# Patient Record
Sex: Male | Born: 1960 | Race: White | Hispanic: No | Marital: Married | Smoking: Current every day smoker
Health system: Southern US, Community
[De-identification: ages and names within clinical notes are randomized; demographics above are authoritative.]

## PROBLEM LIST (undated history)

## (undated) DIAGNOSIS — I2510 Atherosclerotic heart disease of native coronary artery without angina pectoris: Secondary | ICD-10-CM

## (undated) DIAGNOSIS — R51 Headache: Secondary | ICD-10-CM

## (undated) DIAGNOSIS — I671 Cerebral aneurysm, nonruptured: Secondary | ICD-10-CM

## (undated) DIAGNOSIS — G4730 Sleep apnea, unspecified: Secondary | ICD-10-CM

## (undated) DIAGNOSIS — G510 Bell's palsy: Secondary | ICD-10-CM

---

## 2005-10-21 ENCOUNTER — Emergency Department (HOSPITAL_COMMUNITY): Admission: EM | Admit: 2005-10-21 | Discharge: 2005-10-21 | Payer: Self-pay | Admitting: Emergency Medicine

## 2011-12-30 ENCOUNTER — Emergency Department (HOSPITAL_BASED_OUTPATIENT_CLINIC_OR_DEPARTMENT_OTHER)
Admission: EM | Admit: 2011-12-30 | Discharge: 2011-12-30 | Disposition: A | Discharge status: Home / Self Care | Payer: BC Managed Care – PPO | Attending: Emergency Medicine | Admitting: Emergency Medicine

## 2011-12-30 ENCOUNTER — Encounter (HOSPITAL_BASED_OUTPATIENT_CLINIC_OR_DEPARTMENT_OTHER): Payer: Self-pay | Admitting: Emergency Medicine

## 2011-12-30 DIAGNOSIS — Z23 Encounter for immunization: Secondary | ICD-10-CM | POA: Insufficient documentation

## 2011-12-30 DIAGNOSIS — F17200 Nicotine dependence, unspecified, uncomplicated: Secondary | ICD-10-CM | POA: Insufficient documentation

## 2011-12-30 DIAGNOSIS — L03211 Cellulitis of face: Secondary | ICD-10-CM | POA: Insufficient documentation

## 2011-12-30 DIAGNOSIS — L0201 Cutaneous abscess of face: Secondary | ICD-10-CM

## 2011-12-30 HISTORY — DX: Sleep apnea, unspecified: G47.30

## 2011-12-30 MED ORDER — LIDOCAINE-EPINEPHRINE 2 %-1:100000 IJ SOLN
INTRAMUSCULAR | Status: AC
  Filled 2011-12-30: qty 1

## 2011-12-30 MED ORDER — TETANUS-DIPHTH-ACELL PERTUSSIS 5-2.5-18.5 LF-MCG/0.5 IM SUSP
0.5000 mL | Freq: Once | INTRAMUSCULAR | Status: AC
  Administered 2011-12-30: 0.5 mL via INTRAMUSCULAR
  Filled 2011-12-30: qty 0.5

## 2011-12-30 MED ORDER — CLINDAMYCIN HCL 150 MG PO CAPS: 150.0000 mg | ORAL_CAPSULE | Freq: Three times a day (TID) | ORAL | Status: AC

## 2011-12-30 NOTE — ED Notes (Addendum)
Pt states he had a bump that started in October, but 3-4 days ago it raised and became red and painful. He tried popping it and got blood and pus. The abscess is the size of a half-dollar to the right cheek. Itchy, painful, and burning. Pain in the right eye is starting due to the pressure.

## 2011-12-30 NOTE — ED Provider Notes (Signed)
History     CSN: [id number]  Arrival date & time 12/30/11  4098   First MD Initiated Contact with Patient 12/30/11 2009      Chief Complaint  Patient presents with  . Abscess    (Consider location/radiation/quality/duration/timing/severity/associated sxs/prior treatment) HPI Comments: Pt states that he has a small area to the right cheek that started several months ago:pt states that in the last couple of days the area has gotten very large and red and has had some yellow drainage when picking at the area  Patient is a 51 y.o. male presenting with abscess. The history is provided by the patient. No language interpreter was used.  Abscess  This is a chronic problem. The onset was gradual. The problem has been rapidly worsening. The abscess is present on the face. The abscess is characterized by redness, draining and swelling. It is unknown what he was exposed to. His past medical history does not include atopy in family. There were no sick contacts. He has received no recent medical care.    Past Medical History  Diagnosis Date  . Sleep apnea 8 years ago    History reviewed. No pertinent past surgical history.  History reviewed. No pertinent family history.  History  Substance Use Topics  . Smoking status: Current Everyday Smoker -- 1.0 packs/day for 30 years    Types: Cigarettes  . Smokeless tobacco: Never Used  . Alcohol Use: Yes     occassionally       Review of Systems  Constitutional: Negative.   Eyes: Negative.   Respiratory: Negative.   Cardiovascular: Negative.   Skin: Positive for wound.  Neurological: Negative for numbness.    Allergies  Review of patient's allergies indicates no known allergies.  Home Medications   Current Outpatient Rx  Name Route Sig Dispense Refill  . CLINDAMYCIN HCL 150 MG PO CAPS Oral Take 1 capsule (150 mg total) by mouth 3 (three) times daily. 21 capsule 0    BP 127/82  Pulse 79  Temp(Src) 98.2 F (36.8 C) (Oral)  Resp  18  Ht 5' 11.5" (1.816 m)  Wt 240 lb (108.863 kg)  BMI 33.01 kg/m2  SpO2 98%  Physical Exam  Nursing note and vitals reviewed. Constitutional: He is oriented to person, place, and time. He appears well-developed and well-nourished.  HENT:  Right Ear: External ear normal.  Left Ear: External ear normal.  Eyes: Conjunctivae and EOM are normal. Pupils are equal, round, and reactive to light.  Cardiovascular: Normal rate and regular rhythm.   Pulmonary/Chest: Effort normal and breath sounds normal.  Musculoskeletal: Normal range of motion.  Neurological: He is alert and oriented to person, place, and time.  Skin:       Pt has large fluctuant red area noted to the right cheek  Psychiatric: He has a normal mood and affect.    ED Course  INCISION AND DRAINAGE Performed by: Teressa Lower Authorized by: Teressa Lower Consent: Verbal consent obtained. Written consent not obtained. Risks and benefits: risks, benefits and alternatives were discussed Consent given by: patient Patient understanding: patient does not state understanding of the procedure being performed Patient identity confirmed: verbally with patient Time out: Immediately prior to procedure a "time out" was called to verify the correct patient, procedure, equipment, support staff and site/side marked as required. Type: abscess Body area: head/neck Location details: face Local anesthetic: lidocaine 2% with epinephrine Scalpel size: 11 Incision type: single straight Complexity: simple Drainage: purulent Drainage amount: copious Patient tolerance:  Patient tolerated the procedure well with no immediate complications.   (including critical care time)  Labs Reviewed - No data to display No results found.   1. Facial abscess       MDM  Area decreased in size:discussed with pt the need to follow up recheck as the area is on his face and unsure of what the initial area was         Teressa Lower,  NP 12/30/11 2117

## 2011-12-30 NOTE — Discharge Instructions (Signed)

## 2011-12-31 NOTE — ED Provider Notes (Signed)
Medical screening examination/treatment/procedure(s) were performed by non-physician practitioner and as supervising physician I was immediately available for consultation/collaboration.  Laiah Pouncey, MD 12/31/11 0016 

## 2012-12-16 ENCOUNTER — Emergency Department (HOSPITAL_BASED_OUTPATIENT_CLINIC_OR_DEPARTMENT_OTHER): Payer: BC Managed Care – PPO

## 2012-12-16 ENCOUNTER — Encounter (HOSPITAL_BASED_OUTPATIENT_CLINIC_OR_DEPARTMENT_OTHER): Payer: Self-pay | Admitting: *Deleted

## 2012-12-16 ENCOUNTER — Observation Stay (HOSPITAL_BASED_OUTPATIENT_CLINIC_OR_DEPARTMENT_OTHER)
Admission: EM | Admit: 2012-12-16 | Discharge: 2012-12-18 | Disposition: A | Discharge status: Home / Self Care | Payer: BC Managed Care – PPO | Attending: Internal Medicine | Admitting: Internal Medicine

## 2012-12-16 ENCOUNTER — Observation Stay (HOSPITAL_COMMUNITY): Payer: BC Managed Care – PPO

## 2012-12-16 DIAGNOSIS — K006 Disturbances in tooth eruption: Secondary | ICD-10-CM | POA: Insufficient documentation

## 2012-12-16 DIAGNOSIS — I639 Cerebral infarction, unspecified: Secondary | ICD-10-CM

## 2012-12-16 DIAGNOSIS — I671 Cerebral aneurysm, nonruptured: Secondary | ICD-10-CM

## 2012-12-16 DIAGNOSIS — G4730 Sleep apnea, unspecified: Secondary | ICD-10-CM | POA: Insufficient documentation

## 2012-12-16 DIAGNOSIS — R29810 Facial weakness: Secondary | ICD-10-CM

## 2012-12-16 DIAGNOSIS — I6350 Cerebral infarction due to unspecified occlusion or stenosis of unspecified cerebral artery: Principal | ICD-10-CM | POA: Insufficient documentation

## 2012-12-16 DIAGNOSIS — E785 Hyperlipidemia, unspecified: Secondary | ICD-10-CM | POA: Insufficient documentation

## 2012-12-16 DIAGNOSIS — F17200 Nicotine dependence, unspecified, uncomplicated: Secondary | ICD-10-CM

## 2012-12-16 DIAGNOSIS — G510 Bell's palsy: Secondary | ICD-10-CM

## 2012-12-16 DIAGNOSIS — Z720 Tobacco use: Secondary | ICD-10-CM | POA: Diagnosis present

## 2012-12-16 DIAGNOSIS — E7800 Pure hypercholesterolemia, unspecified: Secondary | ICD-10-CM

## 2012-12-16 DIAGNOSIS — K011 Impacted teeth: Secondary | ICD-10-CM | POA: Diagnosis present

## 2012-12-16 HISTORY — DX: Cerebral aneurysm, nonruptured: I67.1

## 2012-12-16 HISTORY — DX: Headache: R51

## 2012-12-16 LAB — CBC WITH DIFFERENTIAL/PLATELET
Basophils Absolute: 0 10*3/uL (ref 0.0–0.1)
HCT: 44.3 % (ref 39.0–52.0)
Lymphocytes Relative: 40 % (ref 12–46)
Lymphs Abs: 2.1 10*3/uL (ref 0.7–4.0)
Monocytes Absolute: 0.4 10*3/uL (ref 0.1–1.0)
Neutro Abs: 2.6 10*3/uL (ref 1.7–7.7)
Platelets: 147 10*3/uL — ABNORMAL LOW (ref 150–400)
RBC: 5.12 MIL/uL (ref 4.22–5.81)
RDW: 13.7 % (ref 11.5–15.5)
WBC: 5.1 10*3/uL (ref 4.0–10.5)

## 2012-12-16 LAB — BASIC METABOLIC PANEL
CO2: 26 mEq/L (ref 19–32)
Chloride: 105 mEq/L (ref 96–112)
Glucose, Bld: 108 mg/dL — ABNORMAL HIGH (ref 70–99)
Sodium: 141 mEq/L (ref 135–145)

## 2012-12-16 MED ORDER — ASPIRIN 325 MG PO TABS
325.0000 mg | ORAL_TABLET | Freq: Every day | ORAL | Status: DC
  Administered 2012-12-16 – 2012-12-18 (×3): 325 mg via ORAL
  Filled 2012-12-16 (×5): qty 1

## 2012-12-16 MED ORDER — SENNOSIDES-DOCUSATE SODIUM 8.6-50 MG PO TABS: 1.0000 | ORAL_TABLET | Freq: Every evening | ORAL | Status: DC | PRN

## 2012-12-16 MED ORDER — ENOXAPARIN SODIUM 40 MG/0.4ML ~~LOC~~ SOLN
40.0000 mg | SUBCUTANEOUS | Status: DC
  Administered 2012-12-16 – 2012-12-17 (×2): 40 mg via SUBCUTANEOUS
  Filled 2012-12-16 (×3): qty 0.4

## 2012-12-16 MED ORDER — ASPIRIN 300 MG RE SUPP
300.0000 mg | Freq: Every day | RECTAL | Status: DC
  Filled 2012-12-16 (×3): qty 1

## 2012-12-16 NOTE — H&P (Signed)
[facility] History and Physical  Jason Wells ZOX:[id number] DOB: 06-Jul-1961 DOA: 12/16/2012  Referring physician: ED PCP: Patient does not have a primary care physician   Chief Complaint: Facial palsy  HPI: Jason Wells is a 52 y.o. male with ongoing tobacco abuse presented to the med [facility] with left-sided facial weakness. He also reports left upper extremity numbness. Denies any speech difficulty, denies difficulty ambulating, denies any acute intercurrent illnesses. He is on no medications and smokes one pack of cigarettes a day. Patient also reports an impacted left lower jaw wisdom tooth . He is placed on observation to obtain an MRI   Review of Systems:  Positive for being diagnosed with sleep apnea and being under a lot of stress Patient wears glasses for astigmatism  All other systems reviewed and negative  Past Medical History  Diagnosis Date  . Sleep apnea 8 years ago  . Headache    History reviewed. No pertinent past surgical history. Social History:  reports that he has been smoking Cigarettes.  He has a 30 pack-year smoking history. He has never used smokeless tobacco. He reports that  drinks alcohol. He reports that he does not use illicit drugs. Lives with his wife  No Known Allergies  Family History  Problem Relation Age of Onset  . Cancer Mother      Prior to Admission medications   Medication Sig Start Date End Date Taking? Authorizing Provider  Multiple Vitamins-Minerals (MULTIVITAMIN PO) Take 1 tablet by mouth daily.   Yes Historical Provider, MD  naproxen sodium (ANAPROX) 220 MG tablet Take 440 mg by mouth 2 (two) times daily as needed (for pain).   Yes Historical Provider, MD   Physical Exam: Filed Vitals:   12/16/12 1331 12/16/12 1351 12/16/12 1800 12/16/12 1843  BP: 154/94  134/89   Pulse: 68  69   Temp: 97.7 F (36.5 C) 97.7 F (36.5 C) 98.1 F (36.7 C)   TempSrc: Oral  Oral   Resp: 20  18   Height:    5\' 11"   (1.803 m)  Weight: 108.863 kg (240 lb)   115.395 kg (254 lb 6.4 oz)  SpO2: 100%  100%      General:  Alert and oriented x3, head normocephalic/atraumatic  Eyes: Eyes seem to have nystagmus and gaze coordinating movements, pupil are symmetric and reactive to light  ENT: No significant deformity  Neck: No jugular venous distention  Cardiovascular: Regular rate and rhythm without murmurs rubs or gallops  Respiratory: Clear to auscultation bilaterally  Abdomen: Soft nontender nondistended bowel sounds are present  Skin: Warm dry without rashes  Musculoskeletal: Intact  Psychiatric: Euthymic  Neurologic: NIH stroke scale is 2 from a complete left facial paralysis involving nasolabial fold eyes and forehead.  Strength is 5 out of 5 in all 4 extremities, sensation is intact, deep tendon reflexes are symmetric, finger to nose is intact, heel-to-shin intact, speech intact, reading intact, repetition intact  Labs on Admission:  Basic Metabolic Panel:  Recent Labs Lab 12/16/12 1410  NA 141  K 3.8  CL 105  CO2 26  GLUCOSE 108*  BUN 19  CREATININE 0.90  CALCIUM 9.4   Liver Function Tests: No results found for this basename: AST, ALT, ALKPHOS, BILITOT, PROT, ALBUMIN,  in the last 168 hours No results found for this basename: LIPASE, AMYLASE,  in the last 168 hours No results found for this basename: AMMONIA,  in the last 168 hours CBC:  Recent Labs Lab 12/16/12 1410  WBC 5.1  NEUTROABS 2.6  HGB 15.4  HCT 44.3  MCV 86.5  PLT 147*   Cardiac Enzymes: No results found for this basename: CKTOTAL, CKMB, CKMBINDEX, TROPONINI,  in the last 168 hours  BNP (last 3 results) No results found for this basename: PROBNP,  in the last 8760 hours CBG: No results found for this basename: GLUCAP,  in the last 168 hours  Radiological Exams on Admission: Ct Head Wo Contrast  12/16/2012  *RADIOLOGY REPORT*  Clinical Data: Left-sided facial droop.  Numbness of the face and left arm  for 1 day.  CT HEAD WITHOUT CONTRAST  Technique:  Contiguous axial images were obtained from the base of the skull through the vertex without contrast.  Comparison:  None.  Findings:  There is no evidence for acute infarction, intracranial hemorrhage, mass lesion, hydrocephalus, or extra-axial fluid. There is no atrophy or white matter disease.  Calvarium is intact. No significant vascular calcification.  Negative orbits, sinuses, and mastoids.  IMPRESSION:  Negative exam.   Original Report Authenticated By: Davonna Belling, M.D.       Assessment/Plan Principal Problem:   Facial palsy Active Problems:   Tobacco abuse   Sleep apnea   Tooth impaction   1. Facial palsy-patient has features consistent with central neuron deficits. Plan to obtain an MRI to rule out an intracranial pathology. Start aspirin daily. Obtain frequent neurological checks through the night and keep the patient on telemetry. Check fasting lipid panel and hemoglobin A1c 2. Tobacco abuse-counseled. He refused a nicotine patch for now 3. Impacted wisdom tooth-will obtain orthopantogram 4. Sleep apnea-needs primary care physician   Code Status: Full code (must indicate code status--if unknown or must be presumed, indicate so) Family Communication: Wife in room (indicate person spoken with, if applicable, with phone number if by telephone) Disposition Plan:  home (indicate anticipated LOS)    Prima Rayner [facility] Pager [phone]  If 7PM-7AM, please contact night-coverage [email or web address] Password [facility] 12/16/2012, 7:06 PM

## 2012-12-16 NOTE — Progress Notes (Signed)
Received called from [facility] regarding patient Jason Wells, 52 y/o man with pmh of sleep apnea, tobacco abuse and HTN (not on any medication); went to bed with mild tingling.funny sensation on her face (left side), and woke up with facial droop and numbness. Per history provided most likely Bell's palsy; but there is concerns for stroke and MRI not available at [facility] today. Will admit on observation to r/o CVA. Team 10 as assigned team for transfer, tele bed requested.  Deina Lipsey [phone]

## 2012-12-16 NOTE — ED Provider Notes (Signed)
History     CSN: [id number]  Arrival date & time 12/16/12  1325   First MD Initiated Contact with Patient 12/16/12 1352      Chief Complaint  Patient presents with  . Facial Droop    (Consider location/radiation/quality/duration/timing/severity/associated sxs/prior treatment) HPI Comments: Patient is a 52 year old male with a past medical history of sleep apnea who presents with left facial droop since last night. Patient reports watching TV last night when he noticed a "sensation" in the left side of his face. Patient reports going to work this morning and people were asking if he had a stroke so he came to the hospital. Patient reports associated numbness in his left shoulder. He has no other complaints at this time. The facial droop has remained constant since the onset. No aggravating/alleviating factors.    Past Medical History  Diagnosis Date  . Sleep apnea 8 years ago    History reviewed. No pertinent past surgical history.  No family history on file.  History  Substance Use Topics  . Smoking status: Current Every Day Smoker -- 1.00 packs/day for 30 years    Types: Cigarettes  . Smokeless tobacco: Never Used  . Alcohol Use: Yes     Comment: occassionally       Review of Systems  Neurological: Positive for facial asymmetry.  All other systems reviewed and are negative.    Allergies  Review of patient's allergies indicates no known allergies.  Home Medications  No current outpatient prescriptions on file.  BP 154/94  Pulse 68  Temp(Src) 97.7 F (36.5 C) (Oral)  Resp 20  Wt 240 lb (108.863 kg)  BMI 33.01 kg/m2  SpO2 100%  Physical Exam  Nursing note and vitals reviewed. Constitutional: He is oriented to person, place, and time. He appears well-developed and well-nourished. No distress.  HENT:  Head: Normocephalic and atraumatic.  Mouth/Throat: Oropharynx is clear and moist. No oropharyngeal exudate.  Eyes: Conjunctivae and EOM are normal. Pupils  are equal, round, and reactive to light. No scleral icterus.  Neck: Normal range of motion.  Cardiovascular: Normal rate and regular rhythm.  Exam reveals no gallop and no friction rub.   No murmur heard. Pulmonary/Chest: Effort normal and breath sounds normal. He has no wheezes. He has no rales. He exhibits no tenderness.  Abdominal: Soft. He exhibits no distension. There is no tenderness. There is no rebound and no guarding.  Musculoskeletal: Normal range of motion.  Neurological: He is alert and oriented to person, place, and time. A cranial nerve deficit is present. Coordination normal.  Left side facial droop with forehead involvement noted. no other cranial nerve deficit noted besides facial droop. Sensation equal and intact bilaterally. Speech is goal-oriented. Moves limbs without ataxia.   Skin: Skin is warm and dry. He is not diaphoretic.  Psychiatric: He has a normal mood and affect. His behavior is normal.    ED Course  Procedures (including critical care time)   Date: 12/16/2012  Rate: 66  Rhythm: normal sinus rhythm  QRS Axis: normal  Intervals: normal  ST/T Wave abnormalities: normal  Conduction Disutrbances:none  Narrative Interpretation: NSR with no previous for comparison  Old EKG Reviewed: none available    Labs Reviewed  CBC WITH DIFFERENTIAL - Abnormal; Notable for the following:    Platelets 147 (*)    All other components within normal limits  BASIC METABOLIC PANEL - Abnormal; Notable for the following:    Glucose, Bld 108 (*)    All  other components within normal limits   Dg Orthopantogram  12/16/2012  *RADIOLOGY REPORT*  Clinical Data: Left-sided jaw pain.  ORTHOPANTOGRAM/PANORAMIC  Comparison: None.  Findings: Periapical lucency surrounding the root of the second left mandibular molar (tooth #18).  Tooth bud or residual tooth material in the left maxilla related to the third molar (tooth #16).  Absent central incisors in the mandible and absent lateral  incisors in the maxilla.  IMPRESSION: Periapical lucency surrounding the root of tooth #18 in the left mandible, possibly an abscess.  Residual tooth material in the posterior left maxilla (tooth #16).   Original Report Authenticated By: Hulan Saas, M.D.    Ct Head Wo Contrast  12/16/2012  *RADIOLOGY REPORT*  Clinical Data: Left-sided facial droop.  Numbness of the face and left arm for 1 day.  CT HEAD WITHOUT CONTRAST  Technique:  Contiguous axial images were obtained from the base of the skull through the vertex without contrast.  Comparison:  None.  Findings:  There is no evidence for acute infarction, intracranial hemorrhage, mass lesion, hydrocephalus, or extra-axial fluid. There is no atrophy or white matter disease.  Calvarium is intact. No significant vascular calcification.  Negative orbits, sinuses, and mastoids.  IMPRESSION:  Negative exam.   Original Report Authenticated By: Davonna Belling, M.D.    Mr Brain Wo Contrast  12/17/2012  *RADIOLOGY REPORT*  Clinical Data:  Left-sided facial droop.  Numbness face and left arm. Smoker.  MRI BRAIN WITHOUT CONTRAST MRA HEAD WITHOUT CONTRAST  Technique: Multiplanar, multiecho pulse sequences of the brain and surrounding structures were obtained according to standard protocol without intravenous contrast.  Angiographic images of the head were obtained using MRA technique without contrast.  Comparison: 12/16/2012 CT.  No comparison MR.  MRI HEAD  Findings:  Questionable tiny acute non hemorrhagic infarct posterior aspect of the posterior limb of the right internal capsule adjacent to the right atrium (series 4 image 16).  No intracranial hemorrhage.  No intracranial mass lesion detected on this unenhanced exam.  Very mild nonspecific white matter type changes most notable right frontal lobe may reflect changes of small vessel disease.  Mild soft tissue prominence posterior-superior nasopharynx may represent small Thornwaldt cyst with complex cyst within the  left fossa of Rosenmueller.  Primary mucosal abnormality not entirely excluded although a secondary consideration.  No secondary findings of eustachian tube dysfunction as the mastoid air cells and middle ear cavities are clear.  Nonspecific right cheek 1.3 x 0.9 x 0.8 cm structure.  Minimal mucosal thickening paranasal sinuses with polypoid opacification right maxillary sinus.  Major intracranial vascular structures are patent.  IMPRESSION: Questionable tiny acute non hemorrhagic infarct posterior aspect of the posterior limb of the right internal capsule adjacent to the right atrium.  Please see above for additional findings  MRA HEAD  Findings: Anterior circulation without medium or large size vessel significant stenosis or occlusion.  Mild narrowing A1 segment right anterior cerebral artery.  Very mild narrowing M1 segment right middle cerebral artery.  Mild ectasia of the vertebral arteries and basilar artery without high-grade stenosis.  Nonvisualization left AICA.  Mild irregularity superior cerebellar arteries and posterior cerebral arteries.  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  Please see above.  This has been made a PRA call report utilizing dashboard call feature.   Original Report Authenticated By: Lacy Duverney, M.D.    Mr Mra Head/brain Wo Cm  12/17/2012  *RADIOLOGY REPORT*  Clinical Data:  Left-sided facial droop.  Numbness face and left arm. Smoker.  MRI BRAIN WITHOUT CONTRAST MRA HEAD WITHOUT CONTRAST  Technique: Multiplanar, multiecho pulse sequences of the brain and surrounding structures were obtained according to standard protocol without intravenous contrast.  Angiographic images of the head were obtained using MRA technique without contrast.  Comparison: 12/16/2012 CT.  No comparison MR.  MRI HEAD  Findings:  Questionable tiny acute non hemorrhagic infarct posterior aspect of the posterior  limb of the right internal capsule adjacent to the right atrium (series 4 image 16).  No intracranial hemorrhage.  No intracranial mass lesion detected on this unenhanced exam.  Very mild nonspecific white matter type changes most notable right frontal lobe may reflect changes of small vessel disease.  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  Primary mucosal abnormality not entirely excluded although a secondary consideration.  No secondary findings of eustachian tube dysfunction as the mastoid air cells and middle ear cavities are clear.  Nonspecific right cheek 1.3 x 0.9 x 0.8 cm structure.  Minimal mucosal thickening paranasal sinuses with polypoid opacification right maxillary sinus.  Major intracranial vascular structures are patent.  IMPRESSION: Questionable tiny acute non hemorrhagic infarct posterior aspect of the posterior limb of the right internal capsule adjacent to the right atrium.  Please see above for additional findings  MRA HEAD  Findings: Anterior circulation without medium or large size vessel significant stenosis or occlusion.  Mild narrowing A1 segment right anterior cerebral artery.  Very mild narrowing M1 segment right middle cerebral artery.  Mild ectasia of the vertebral arteries and basilar artery without high-grade stenosis.  Nonvisualization left AICA.  Mild irregularity superior cerebellar arteries and posterior cerebral arteries.  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  Please see above.  This has been made a PRA call report utilizing dashboard call feature.   Original Report Authenticated By: Lacy Duverney, M.D.      1. Facial droop   2. Facial palsy   3. Sleep apnea   4. Tobacco abuse       MDM  2:21 PM Patient will have CT head, basic labs and EKG.   3:13  PM Head CT, labs, and EKG unremarkable. Patient will be admitted for stroke work up.         Emilia Beck, PA-C 12/17/12 1001

## 2012-12-16 NOTE — ED Notes (Signed)
Patient transported to CT 

## 2012-12-16 NOTE — ED Notes (Signed)
Left sided facial droop and unable to close his left eye. Numbness to his face and left arm since last night. Hx of infected wisdom tooth he thinks.

## 2012-12-17 ENCOUNTER — Observation Stay (HOSPITAL_COMMUNITY): Payer: BC Managed Care – PPO

## 2012-12-17 ENCOUNTER — Encounter (HOSPITAL_COMMUNITY): Payer: Self-pay | Admitting: Neurology

## 2012-12-17 DIAGNOSIS — E7800 Pure hypercholesterolemia, unspecified: Secondary | ICD-10-CM

## 2012-12-17 DIAGNOSIS — E785 Hyperlipidemia, unspecified: Secondary | ICD-10-CM

## 2012-12-17 DIAGNOSIS — K006 Disturbances in tooth eruption: Secondary | ICD-10-CM

## 2012-12-17 DIAGNOSIS — I639 Cerebral infarction, unspecified: Secondary | ICD-10-CM

## 2012-12-17 DIAGNOSIS — I671 Cerebral aneurysm, nonruptured: Secondary | ICD-10-CM

## 2012-12-17 LAB — HEMOGLOBIN A1C
Hgb A1c MFr Bld: 6.9 % — ABNORMAL HIGH (ref <5.7)
Mean Plasma Glucose: 151 mg/dL — ABNORMAL HIGH (ref <117)

## 2012-12-17 MED ORDER — ATORVASTATIN CALCIUM 10 MG PO TABS
10.0000 mg | ORAL_TABLET | Freq: Every day | ORAL | Status: DC
  Administered 2012-12-17: 10 mg via ORAL
  Filled 2012-12-17 (×3): qty 1

## 2012-12-17 NOTE — ED Provider Notes (Signed)
Medical screening examination/treatment/procedure(s) were performed by non-physician practitioner and as supervising physician I was immediately available for consultation/collaboration.   Gwyneth Sprout, MD 12/17/12 2018

## 2012-12-17 NOTE — Progress Notes (Signed)
RN received call from MRI about test results. 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  Notified Dr. Ardyth Harps of these results. Jason Wells, Jason Marie, RN

## 2012-12-17 NOTE — Progress Notes (Signed)
*  PRELIMINARY RESULTS* Vascular Ultrasound Carotid Duplex (Doppler) has been completed.   There is no obvious evidence of hemodynamically significant carotid artery stenosis bilaterally. Vertebral arteries are patent with antegrade flow.  12/17/2012 4:32 PM Gertie Fey, RDMS, RDCS

## 2012-12-17 NOTE — Consult Note (Signed)
Referring Physician: Ardyth Harps    Chief Complaint: left facial droop   HPI:                                                                                                                                         Jason Wells is an 52 y.o. male with no past medical history other than tobacco abuse. Patient does not take ASA on a a daily basis. He noted on Suday afternoon he had a left facial droop, along with left shoulder pain and decreased sensation along left arm. Hi left arm symptoms have resolved (he often will have bilateral arm tingling when he awakens from sleep).  He continues to have a full left sided facial droop and inability to fully shut his left eyelids. HE denies any hyperacusis of left ear or any decreased taste on left tongue.   Date last known well: 12-15-12 Time last known well: 12-15-12 tPA Given: No: out of window  Past Medical History  Diagnosis Date  . Sleep apnea 8 years ago  . Headache     History reviewed. No pertinent past surgical history.  Family History  Problem Relation Age of Onset  . Cancer Mother    Social History:  reports that he has been smoking Cigarettes.  He has a 30 pack-year smoking history. He has never used smokeless tobacco. He reports that  drinks alcohol. He reports that he does not use illicit drugs.  Allergies: No Known Allergies  Medications:                                                                                                                           Prior to Admission:  Prescriptions prior to admission  Medication Sig Dispense Refill  . Multiple Vitamins-Minerals (MULTIVITAMIN PO) Take 1 tablet by mouth daily.      . naproxen sodium (ANAPROX) 220 MG tablet Take 440 mg by mouth 2 (two) times daily as needed (for pain).       Scheduled: . aspirin  300 mg Rectal Daily   Or  . aspirin  325 mg Oral Daily  . atorvastatin  10 mg Oral q1800  . enoxaparin (LOVENOX) injection  40 mg Subcutaneous Q24H    ROS:  History obtained from the patient  General ROS: negative for - chills, fatigue, fever, night sweats, weight gain or weight loss Psychological ROS: negative for - behavioral disorder, hallucinations, memory difficulties, mood swings or suicidal ideation Ophthalmic ROS: negative for - blurry vision, double vision, eye pain or loss of vision ENT ROS: negative for - epistaxis, nasal discharge, oral lesions, sore throat, tinnitus or vertigo Allergy and Immunology ROS: negative for - hives or itchy/watery eyes Hematological and Lymphatic ROS: negative for - bleeding problems, bruising or swollen lymph nodes Endocrine ROS: negative for - galactorrhea, hair pattern changes, polydipsia/polyuria or temperature intolerance Respiratory ROS: negative for - cough, hemoptysis, shortness of breath or wheezing Cardiovascular ROS: negative for - chest pain, dyspnea on exertion, edema or irregular heartbeat Gastrointestinal ROS: negative for - abdominal pain, diarrhea, hematemesis, nausea/vomiting or stool incontinence Genito-Urinary ROS: negative for - dysuria, hematuria, incontinence or urinary frequency/urgency Musculoskeletal ROS: negative for - joint swelling or muscular weakness Neurological ROS: as noted in HPI Dermatological ROS: negative for rash and skin lesion changes  Neurologic Examination:                                                                                                      Blood pressure 123/72, pulse 60, temperature 97.6 F (36.4 C), temperature source Oral, resp. rate 18, height 5\' 11"  (1.803 m), weight 115.395 kg (254 lb 6.4 oz), SpO2 98.00%.   Mental Status: Alert, oriented, thought content appropriate.  Speech fluent without evidence of aphasia but mild dysarthria.  Able to follow 3 step commands without difficulty. Cranial Nerves: II: Discs flat  bilaterally; Visual fields grossly normal, pupils equal, round, reactive to light and accommodation III,IV, VI: ptosis not present, extra-ocular motions intact bilaterally but unable to fully shut and burry his eyelashes on the left eye.  Nystagmus on left lateral gaze V,VII: smile asymmetric, facial light touch sensation normal bilaterally--slightly decreased on the left VIII: hearing normal bilaterally IX,X: gag reflex present XI: bilateral shoulder shrug XII: midline tongue extension Motor: Right : Upper extremity   5/5    Left:     Upper extremity   5/5  Lower extremity   5/5     Lower extremity   5/5 Tone and bulk:normal tone throughout; no atrophy noted Sensory: Pinprick and light touch intact throughout, bilaterally Deep Tendon Reflexes: 2+ and symmetric throughout Plantars: Right: downgoing   Left: downgoing Cerebellar: normal finger-to-nose,  normal heel-to-shin test CV: pulses palpable throughout    Results for orders placed during the hospital encounter of 12/16/12 (from the past 48 hour(s))  CBC WITH DIFFERENTIAL     Status: Abnormal   Collection Time    12/16/12  2:10 PM      Result Value Range   WBC 5.1  4.0 - 10.5 K/uL   RBC 5.12  4.22 - 5.81 MIL/uL   Hemoglobin 15.4  13.0 - 17.0 g/dL   HCT 16.1  09.6 - 04.5 %   MCV 86.5  78.0 - 100.0 fL   MCH 30.1  26.0 - 34.0 pg   MCHC 34.8  30.0 - 36.0 g/dL   RDW 11.9  14.7 - 82.9 %   Platelets 147 (*) 150 - 400 K/uL   Neutrophils Relative 50  43 - 77 %   Neutro Abs 2.6  1.7 - 7.7 K/uL   Lymphocytes Relative 40  12 - 46 %   Lymphs Abs 2.1  0.7 - 4.0 K/uL   Monocytes Relative 7  3 - 12 %   Monocytes Absolute 0.4  0.1 - 1.0 K/uL   Eosinophils Relative 2  0 - 5 %   Eosinophils Absolute 0.1  0.0 - 0.7 K/uL   Basophils Relative 0  0 - 1 %   Basophils Absolute 0.0  0.0 - 0.1 K/uL  BASIC METABOLIC PANEL     Status: Abnormal   Collection Time    12/16/12  2:10 PM      Result Value Range   Sodium 141  135 - 145 mEq/L    Potassium 3.8  3.5 - 5.1 mEq/L   Chloride 105  96 - 112 mEq/L   CO2 26  19 - 32 mEq/L   Glucose, Bld 108 (*) 70 - 99 mg/dL   BUN 19  6 - 23 mg/dL   Creatinine, Ser 5.62  0.50 - 1.35 mg/dL   Calcium 9.4  8.4 - 13.0 mg/dL   GFR calc non Af Amer >90  >90 mL/min   GFR calc Af Amer >90  >90 mL/min   Comment:            The eGFR has been calculated     using the CKD EPI equation.     This calculation has not been     validated in all clinical     situations.     eGFR's persistently     <90 mL/min signify     possible Chronic Kidney Disease.  LIPID PANEL     Status: Abnormal   Collection Time    12/17/12  5:45 AM      Result Value Range   Cholesterol 246 (*) 0 - 200 mg/dL   Triglycerides 865 (*) <150 mg/dL   HDL 30 (*) >78 mg/dL   Total CHOL/HDL Ratio 8.2     VLDL 44 (*) 0 - 40 mg/dL   LDL Cholesterol 469 (*) 0 - 99 mg/dL   Comment:            Total Cholesterol/HDL:CHD Risk     Coronary Heart Disease Risk Table                         Men   Women      1/2 Average Risk   3.4   3.3      Average Risk       5.0   4.4      2 X Average Risk   9.6   7.1      3 X Average Risk  23.4   11.0                Use the calculated Patient Ratio     above and the CHD Risk Table     to determine the patient's CHD Risk.                ATP III CLASSIFICATION (LDL):      <100     mg/dL   Optimal      629-528  mg/dL   Near or Above  Optimal      130-159  mg/dL   Borderline      191-478  mg/dL   High      >295     mg/dL   Very High   Dg Orthopantogram  12/16/2012  *RADIOLOGY REPORT*  Clinical Data: Left-sided jaw pain.  ORTHOPANTOGRAM/PANORAMIC  Comparison: None.  Findings: Periapical lucency surrounding the root of the second left mandibular molar (tooth #18).  Tooth bud or residual tooth material in the left maxilla related to the third molar (tooth #16).  Absent central incisors in the mandible and absent lateral incisors in the maxilla.  IMPRESSION: Periapical lucency  surrounding the root of tooth #18 in the left mandible, possibly an abscess.  Residual tooth material in the posterior left maxilla (tooth #16).   Original Report Authenticated By: Hulan Saas, M.D.    Ct Head Wo Contrast  12/16/2012  *RADIOLOGY REPORT*  Clinical Data: Left-sided facial droop.  Numbness of the face and left arm for 1 day.  CT HEAD WITHOUT CONTRAST  Technique:  Contiguous axial images were obtained from the base of the skull through the vertex without contrast.  Comparison:  None.  Findings:  There is no evidence for acute infarction, intracranial hemorrhage, mass lesion, hydrocephalus, or extra-axial fluid. There is no atrophy or white matter disease.  Calvarium is intact. No significant vascular calcification.  Negative orbits, sinuses, and mastoids.  IMPRESSION:  Negative exam.   Original Report Authenticated By: Davonna Belling, M.D.    Mr Brain Wo Contrast  12/17/2012  *RADIOLOGY REPORT*  Clinical Data:  Left-sided facial droop.  Numbness face and left arm. Smoker.  MRI BRAIN WITHOUT CONTRAST MRA HEAD WITHOUT CONTRAST  Technique: Multiplanar, multiecho pulse sequences of the brain and surrounding structures were obtained according to standard protocol without intravenous contrast.  Angiographic images of the head were obtained using MRA technique without contrast.  Comparison: 12/16/2012 CT.  No comparison MR.  MRI HEAD  Findings:  Questionable tiny acute non hemorrhagic infarct posterior aspect of the posterior limb of the right internal capsule adjacent to the right atrium (series 4 image 16).  No intracranial hemorrhage.  No intracranial mass lesion detected on this unenhanced exam.  Very mild nonspecific white matter type changes most notable right frontal lobe may reflect changes of small vessel disease.  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  Primary mucosal abnormality  not entirely excluded although a secondary consideration.  No secondary findings of eustachian tube dysfunction as the mastoid air cells and middle ear cavities are clear.  Nonspecific right cheek 1.3 x 0.9 x 0.8 cm structure.  Minimal mucosal thickening paranasal sinuses with polypoid opacification right maxillary sinus.  Major intracranial vascular structures are patent.  IMPRESSION: Questionable tiny acute non hemorrhagic infarct posterior aspect of the posterior limb of the right internal capsule adjacent to the right atrium.  Please see above for additional findings  MRA HEAD  Findings: Anterior circulation without medium or large size vessel significant stenosis or occlusion.  Mild narrowing A1 segment right anterior cerebral artery.  Very mild narrowing M1 segment right middle cerebral artery.  Mild ectasia of the vertebral arteries and basilar artery without high-grade stenosis.  Nonvisualization left AICA.  Mild irregularity superior cerebellar arteries and posterior cerebral arteries.  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  Please see above.  This  has been made a PRA call report utilizing dashboard call feature.   Original Report Authenticated By: Lacy Duverney, M.D.    Mr Mra Head/brain Wo Cm  12/17/2012  *RADIOLOGY REPORT*  Clinical Data:  Left-sided facial droop.  Numbness face and left arm. Smoker.  MRI BRAIN WITHOUT CONTRAST MRA HEAD WITHOUT CONTRAST  Technique: Multiplanar, multiecho pulse sequences of the brain and surrounding structures were obtained according to standard protocol without intravenous contrast.  Angiographic images of the head were obtained using MRA technique without contrast.  Comparison: 12/16/2012 CT.  No comparison MR.  MRI HEAD  Findings:  Questionable tiny acute non hemorrhagic infarct posterior aspect of the posterior limb of the right internal capsule adjacent to the right  atrium (series 4 image 16).  No intracranial hemorrhage.  No intracranial mass lesion detected on this unenhanced exam.  Very mild nonspecific white matter type changes most notable right frontal lobe may reflect changes of small vessel disease.  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  Primary mucosal abnormality not entirely excluded although a secondary consideration.  No secondary findings of eustachian tube dysfunction as the mastoid air cells and middle ear cavities are clear.  Nonspecific right cheek 1.3 x 0.9 x 0.8 cm structure.  Minimal mucosal thickening paranasal sinuses with polypoid opacification right maxillary sinus.  Major intracranial vascular structures are patent.  IMPRESSION: Questionable tiny acute non hemorrhagic infarct posterior aspect of the posterior limb of the right internal capsule adjacent to the right atrium.  Please see above for additional findings  MRA HEAD  Findings: Anterior circulation without medium or large size vessel significant stenosis or occlusion.  Mild narrowing A1 segment right anterior cerebral artery.  Very mild narrowing M1 segment right middle cerebral artery.  Mild ectasia of the vertebral arteries and basilar artery without high-grade stenosis.  Nonvisualization left AICA.  Mild irregularity superior cerebellar arteries and posterior cerebral arteries.  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  Please see above.  This has been made a PRA call report utilizing dashboard call feature.   Original Report Authenticated By: Lacy Duverney, M.D.    LDL--172  A1C--in process 2 D echo pending  Assessment and plan discussed with with attending physician and they are in agreement.    Felicie Morn PA-C [facility] [phone]  12/17/2012, 11:37  AM  Patient seen and examined.  Clinical course and management discussed.  Necessary edits performed.  I agree with the above.  Assessment and plan of care developed and discussed below.     Assessment: 52 y.o. male with acute onset left facial droop.  Small area on acute ischemia on MR imaging that corresponds to clinical findings despite peripheral characteristics on exam.  Stroke work up recommended. Aneurysm noted on imaging not likely related to current symptoms.  Will require follow up on an outpatient basis.      Stroke Risk Factors - smoking  Plan: 1. HgbA1c, 2. PT consult, OT consult, Speech consult 3. Echocardiogram 4. Carotid dopplers 5. Prophylactic therapy-Antiplatelet med: Aspirin - dose 325 daily 6. Risk factor modification 7. Telemetry monitoring 8. Frequent neuro checks  Thana Farr, MD [facility] [phone]  12/17/2012  2:12 PM

## 2012-12-17 NOTE — Evaluation (Signed)
Occupational Therapy Evaluation Patient Details Name: Jason Wells MRN: [id number] DOB: [birth year] Today's Date: 12/17/2012 Time: 4098-1191 OT Time Calculation (min): 31 min  OT Assessment / Plan / Recommendation Clinical Impression   This 52 y.o. Male admitted with left facial droop, along with left shoulder pain and decreased sensation along left arm. MRI with questionable tiny acute infarct posterior aspect of the posterior limb of the Rt internal capsule.  Pt with inability to fully close Lt. Eye, however, no visual deficits noted that impact pt functioning.  No OT needs identified, will sign off.     OT Assessment  Patient does not need any further OT services    Follow Up Recommendations  No OT follow up    Barriers to Discharge      Equipment Recommendations  None recommended by OT    Recommendations for Other Services    Frequency       Precautions / Restrictions Precautions Precautions: None Restrictions Weight Bearing Restrictions: No       ADL  Eating/Feeding: Independent Where Assessed - Eating/Feeding: Chair;Edge of bed Grooming: Wash/dry hands;Wash/dry face;Shaving;Teeth care;Independent Where Assessed - Grooming: Unsupported standing Upper Body Bathing: Independent Where Assessed - Upper Body Bathing: Unsupported sitting;Unsupported standing Lower Body Bathing: Independent Where Assessed - Lower Body Bathing: Unsupported sit to stand Upper Body Dressing: Independent Where Assessed - Upper Body Dressing: Unsupported sitting Lower Body Dressing: Independent Where Assessed - Lower Body Dressing: Supported sit to stand Toilet Transfer: Independent Statistician Method: Sit to Barista: Comfort height toilet Toileting - Architect and Hygiene: Independent Where Assessed - Engineer, mining and Hygiene: Standing Tub/Shower Transfer: Designer, industrial/product Method: Land: Walk in shower Transfers/Ambulation Related to ADLs: Independent ADL Comments: Pt independent with no deficits.      OT Diagnosis:    OT Problem List:   OT Treatment Interventions:     OT Goals    Visit Information  Last OT Received On: 12/17/12 Assistance Needed: +1    Subjective Data  Subjective: "I'm so ready to go home" Patient Stated Goal: to go home   Prior Functioning     Home Living Lives With: Spouse Available Help at Discharge: Family;Available PRN/intermittently Type of Home: House Home Access: Stairs to enter [employer] of Steps: 3 Entrance Stairs-Rails: None Home Layout: One level Bathroom Shower/Tub: Walk-in shower;Tub/shower unit Dentist: None Prior Function Level of Independence: Independent Able to Take Stairs?: Yes Driving: Yes Vocation: Full time employment Communication Communication: No difficulties Dominant Hand: Right         Vision/Perception Vision - History Baseline Vision: Wears glasses all the time Patient Visual Report: Blurring of vision (Lt eye and difficulty closing Lt eye) Vision - Assessment Eye Alignment: Within Functional Limits Vision Assessment: Vision tested Ocular Range of Motion: Within Functional Limits Tracking/Visual Pursuits: Able to track stimulus in all quads without difficulty Saccades: Within functional limits Visual Fields: No apparent deficits Additional Comments: Pt able to toss ball Lt to Rt. hand while ambulating and reading signage on wall with no difficulties. Also able to read newspaper with no difficulty Perception Perception: Within Functional Limits Praxis Praxis: Intact   Cognition  Cognition Overall Cognitive Status: Appears within functional limits for tasks assessed/performed Arousal/Alertness: Awake/alert Orientation Level: Oriented X4 / Intact Behavior During Session: [facility] for tasks performed    Extremity/Trunk  Assessment Right Upper Extremity Assessment RUE ROM/Strength/Tone: Within functional levels RUE Sensation: WFL - Light Touch RUE  Coordination: WFL - gross/fine motor Left Upper Extremity Assessment LUE ROM/Strength/Tone: Within functional levels LUE Sensation: WFL - Light Touch LUE Coordination: WFL - gross/fine motor Trunk Assessment Trunk Assessment: Normal     Mobility Bed Mobility Bed Mobility: Supine to Sit Supine to Sit: 7: Independent Transfers Transfers: Sit to Stand;Stand to Sit Sit to Stand: 7: Independent Stand to Sit: 7: Independent     Exercise     Balance     End of Session OT - End of Session Activity Tolerance: Patient tolerated treatment well Patient left: in bed;with call bell/phone within reach;with family/visitor present  GO     Jason Wells M 12/17/2012, 3:57 PM

## 2012-12-17 NOTE — Evaluation (Signed)
SLP reviewed and agree with student findings.   Raylei Losurdo MA, CCC-SLP [phone]    

## 2012-12-17 NOTE — Evaluation (Signed)
Physical Therapy Evaluation Patient Details Name: Jason Wells MRN: [id number] DOB: June 22, 1961 Today's Date: 12/17/2012 Time: 0865-7846 PT Time Calculation (min): 21 min  PT Assessment / Plan / Recommendation Clinical Impression  Pt is 52 yo male admitted for L facial droop and L UE numbness. L UE numbness has resolved however pt remains to have L facial droop. Pt functioning at baseline from functional mobility stand point. pt c/o difficulty eating/swalling due to facial mm weakness. Pt safe to d/c home from physical therapy stand point when medically stable. Pt with no further acute skilled PT needs at this time. PT signing off, please re-consult if needed in future.    PT Assessment  Patent does not need any further PT services    Follow Up Recommendations  No PT follow up    Does the patient have the potential to tolerate intense rehabilitation      Barriers to Discharge        Equipment Recommendations  None recommended by PT    Recommendations for Other Services     Frequency      Precautions / Restrictions Precautions Precautions: None Restrictions Weight Bearing Restrictions: No   Pertinent Vitals/Pain Denies pain      Mobility  Bed Mobility Bed Mobility: Supine to Sit Supine to Sit: 7: Independent Transfers Transfers: Sit to Stand;Stand to Sit Sit to Stand: 7: Independent Stand to Sit: 7: Independent Details for Transfer Assistance: safe technique Ambulation/Gait Ambulation/Gait Assistance: 7: Independent Ambulation Distance (Feet): 300 Feet Assistive device: None Ambulation/Gait Assistance Details: no LOB, safe technique Gait Pattern: Within Functional Limits Gait velocity: wfl Stairs: Yes Stairs Assistance: 6: Modified independent (Device/Increase time) Stair Management Technique: One rail Right Number of Stairs: 12 Modified Rankin (Stroke Patients Only) Pre-Morbid Rankin Score: No symptoms Modified Rankin: No symptoms    Exercises      PT Diagnosis:    PT Problem List:   PT Treatment Interventions:     PT Goals Acute Rehab PT Goals PT Goal Formulation:  (n/a)  Visit Information  Last PT Received On: 12/17/12 Assistance Needed: +1    Subjective Data  Subjective: Pt received supine in bed with c/o L facial droop. Patient Stated Goal: home   Prior Functioning  Home Living Lives With: Spouse Available Help at Discharge: Family;Available PRN/intermittently Type of Home: House Home Access: Stairs to enter [employer] of Steps: 3 Entrance Stairs-Rails: None Home Layout: One level Bathroom Shower/Tub: Walk-in shower;Tub/shower unit Bathroom Toilet: Standard Home Adaptive Equipment: None Prior Function Level of Independence: Independent Able to Take Stairs?: Yes Driving: Yes Vocation: Full time employment Comments: Astronomer Communication: No difficulties Dominant Hand: Right    Cognition  Cognition Overall Cognitive Status: Appears within functional limits for tasks assessed/performed Arousal/Alertness: Awake/alert Orientation Level: Oriented X4 / Intact Behavior During Session: WFL for tasks performed    Extremity/Trunk Assessment Right Upper Extremity Assessment RUE ROM/Strength/Tone: Within functional levels RUE Sensation: WFL - Light Touch RUE Coordination: WFL - gross/fine motor Left Upper Extremity Assessment LUE ROM/Strength/Tone: Within functional levels LUE Sensation: WFL - Light Touch LUE Coordination: WFL - gross/fine motor Right Lower Extremity Assessment RLE ROM/Strength/Tone: Within functional levels RLE Sensation: WFL - Light Touch RLE Coordination: WFL - gross/fine motor Left Lower Extremity Assessment LLE ROM/Strength/Tone: Within functional levels LLE Sensation: WFL - Light Touch LLE Coordination: WFL - gross/fine motor Trunk Assessment Trunk Assessment: Normal   Balance Balance Balance Assessed: Yes Static Standing Balance Static Standing -  Balance Support: No upper extremity supported Static  Standing - Level of Assistance: 5: Stand by assistance Static Standing - Comment/# of Minutes: 2 min Single Leg Stance - Right Leg:  (hold 3 sec) Single Leg Stance - Left Leg:  (3 sec) Rhomberg - Eyes Closed:  (1 min) Standardized Balance Assessment Standardized Balance Assessment: Dynamic Gait Index Dynamic Gait Index Level Surface: Normal Change in Gait Speed: Normal Gait with Horizontal Head Turns: Normal Gait with Vertical Head Turns: Normal Gait and Pivot Turn: Normal Step Over Obstacle: Normal Step Around Obstacles: Normal Steps: Mild Impairment Total Score: 23  End of Session PT - End of Session Equipment Utilized During Treatment: Gait belt Activity Tolerance: Patient tolerated treatment well Patient left: in bed;with call bell/phone within reach;with family/visitor present Nurse Communication: Mobility status (pt safe to amb independently)  GP Functional Assessment Tool Used: clinical judgment Functional Limitation: Mobility: Walking and moving around Mobility: Walking and Moving Around Current Status [phone]): 0 percent impaired, limited or restricted Mobility: Walking and Moving Around Goal Status [phone]): 0 percent impaired, limited or restricted Mobility: Walking and Moving Around Discharge Status [phone]): 0 percent impaired, limited or restricted   Marcene Brawn 12/17/2012, 8:52 AM  Lewis Shock, PT, DPT Pager #: [phone] Office #: [phone]

## 2012-12-17 NOTE — Progress Notes (Signed)
  Echocardiogram 2D Echocardiogram has been performed.  Jason Wells 12/17/2012, 3:30 PM

## 2012-12-17 NOTE — Progress Notes (Addendum)
[facility]             Progress Note   Subjective: Still with left ptosis, facial droop. No complaints. Wife Clydie Braun in present and has been updated on plan of care.  Objective: Vital signs in last 24 hours: Temp:  [97.1 F (36.2 C)-98.4 F (36.9 C)] 97.6 F (36.4 C) (03/18 1000) Pulse Rate:  [52-69] 60 (03/18 1000) Resp:  [16-20] 18 (03/18 1000) BP: (109-154)/(72-94) 123/72 mmHg (03/18 1000) SpO2:  [95 %-100 %] 98 % (03/18 1000) Weight:  [108.863 kg (240 lb)-115.395 kg (254 lb 6.4 oz)] 115.395 kg (254 lb 6.4 oz) (03/17 1843) Weight change:  Last BM Date: 12/15/12  Intake/Output from previous day:       Physical Exam: General: Alert, awake, oriented x3. HEENT: No bruits, no goiter. Heart: Regular rate and rhythm, without murmurs, rubs, gallops. Lungs: Clear to auscultation bilaterally. Abdomen: Soft, nontender, nondistended, positive bowel sounds. Extremities: No clubbing cyanosis or edema with positive pedal pulses. Neuro: Left facial droop, left ptosis, inability to wrinkle left side of forehead.    Lab Results: Basic Metabolic Panel:  Recent Labs  21/30/86 1410  NA 141  K 3.8  CL 105  CO2 26  GLUCOSE 108*  BUN 19  CREATININE 0.90  CALCIUM 9.4   CBC:  Recent Labs  12/16/12 1410  WBC 5.1  NEUTROABS 2.6  HGB 15.4  HCT 44.3  MCV 86.5  PLT 147*   Fasting Lipid Panel:  Recent Labs  12/17/12 0545  CHOL 246*  HDL 30*  LDLCALC 172*  TRIG 221*  CHOLHDL 8.2    Studies/Results: Dg Orthopantogram  12/16/2012  *RADIOLOGY REPORT*  Clinical Data: Left-sided jaw pain.  ORTHOPANTOGRAM/PANORAMIC  Comparison: None.  Findings: Periapical lucency surrounding the root of the second left mandibular molar (tooth #18).  Tooth bud or residual tooth material in the left maxilla related to the third molar (tooth #16).  Absent central incisors in the mandible and absent lateral incisors in the maxilla.  IMPRESSION: Periapical lucency surrounding the  root of tooth #18 in the left mandible, possibly an abscess.  Residual tooth material in the posterior left maxilla (tooth #16).   Original Report Authenticated By: Hulan Saas, M.D.    Ct Head Wo Contrast  12/16/2012  *RADIOLOGY REPORT*  Clinical Data: Left-sided facial droop.  Numbness of the face and left arm for 1 day.  CT HEAD WITHOUT CONTRAST  Technique:  Contiguous axial images were obtained from the base of the skull through the vertex without contrast.  Comparison:  None.  Findings:  There is no evidence for acute infarction, intracranial hemorrhage, mass lesion, hydrocephalus, or extra-axial fluid. There is no atrophy or white matter disease.  Calvarium is intact. No significant vascular calcification.  Negative orbits, sinuses, and mastoids.  IMPRESSION:  Negative exam.   Original Report Authenticated By: Davonna Belling, M.D.    Mr Brain Wo Contrast  12/17/2012  *RADIOLOGY REPORT*  Clinical Data:  Left-sided facial droop.  Numbness face and left arm. Smoker.  MRI BRAIN WITHOUT CONTRAST MRA HEAD WITHOUT CONTRAST  Technique: Multiplanar, multiecho pulse sequences of the brain and surrounding structures were obtained according to standard protocol without intravenous contrast.  Angiographic images of the head were obtained using MRA technique without contrast.  Comparison: 12/16/2012 CT.  No comparison MR.  MRI HEAD  Findings:  Questionable tiny acute non hemorrhagic infarct posterior aspect of the posterior limb of the right internal capsule adjacent to the right atrium (series 4 image  16).  No intracranial hemorrhage.  No intracranial mass lesion detected on this unenhanced exam.  Very mild nonspecific white matter type changes most notable right frontal lobe may reflect changes of small vessel disease.  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  Primary mucosal abnormality not entirely  excluded although a secondary consideration.  No secondary findings of eustachian tube dysfunction as the mastoid air cells and middle ear cavities are clear.  Nonspecific right cheek 1.3 x 0.9 x 0.8 cm structure.  Minimal mucosal thickening paranasal sinuses with polypoid opacification right maxillary sinus.  Major intracranial vascular structures are patent.  IMPRESSION: Questionable tiny acute non hemorrhagic infarct posterior aspect of the posterior limb of the right internal capsule adjacent to the right atrium.  Please see above for additional findings  MRA HEAD  Findings: Anterior circulation without medium or large size vessel significant stenosis or occlusion.  Mild narrowing A1 segment right anterior cerebral artery.  Very mild narrowing M1 segment right middle cerebral artery.  Mild ectasia of the vertebral arteries and basilar artery without high-grade stenosis.  Nonvisualization left AICA.  Mild irregularity superior cerebellar arteries and posterior cerebral arteries.  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  Please see above.  This has been made a PRA call report utilizing dashboard call feature.   Original Report Authenticated By: Lacy Duverney, M.D.    Mr Mra Head/brain Wo Cm  12/17/2012  *RADIOLOGY REPORT*  Clinical Data:  Left-sided facial droop.  Numbness face and left arm. Smoker.  MRI BRAIN WITHOUT CONTRAST MRA HEAD WITHOUT CONTRAST  Technique: Multiplanar, multiecho pulse sequences of the brain and surrounding structures were obtained according to standard protocol without intravenous contrast.  Angiographic images of the head were obtained using MRA technique without contrast.  Comparison: 12/16/2012 CT.  No comparison MR.  MRI HEAD  Findings:  Questionable tiny acute non hemorrhagic infarct posterior aspect of the posterior limb of the right internal capsule adjacent to the right atrium (series  4 image 16).  No intracranial hemorrhage.  No intracranial mass lesion detected on this unenhanced exam.  Very mild nonspecific white matter type changes most notable right frontal lobe may reflect changes of small vessel disease.  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  Primary mucosal abnormality not entirely excluded although a secondary consideration.  No secondary findings of eustachian tube dysfunction as the mastoid air cells and middle ear cavities are clear.  Nonspecific right cheek 1.3 x 0.9 x 0.8 cm structure.  Minimal mucosal thickening paranasal sinuses with polypoid opacification right maxillary sinus.  Major intracranial vascular structures are patent.  IMPRESSION: Questionable tiny acute non hemorrhagic infarct posterior aspect of the posterior limb of the right internal capsule adjacent to the right atrium.  Please see above for additional findings  MRA HEAD  Findings: Anterior circulation without medium or large size vessel significant stenosis or occlusion.  Mild narrowing A1 segment right anterior cerebral artery.  Very mild narrowing M1 segment right middle cerebral artery.  Mild ectasia of the vertebral arteries and basilar artery without high-grade stenosis.  Nonvisualization left AICA.  Mild irregularity superior cerebellar arteries and posterior cerebral arteries.  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  Please see above.  This has been made a PRA call report utilizing dashboard call feature.   Original Report Authenticated By: Lacy Duverney, M.D.     Medications: Scheduled Meds: . aspirin  300 mg Rectal Daily   Or  . aspirin  325 mg Oral Daily  . atorvastatin  10 mg Oral q1800  . enoxaparin (LOVENOX) injection  40 mg Subcutaneous Q24H   Continuous Infusions:  PRN  Meds:.senna-docusate  Assessment/Plan:  Principal Problem:   Facial palsy Active Problems:   Tobacco abuse   Sleep apnea   Tooth impaction   Left facial droop/Ptosis -Initially thought to be a Bell's Palsy. -MRI with a questionable right IC infarct and a left ICA aneurysm. -Have consulted neurology (Dr. Thad Ranger). -ECHO pending. -LDL 172; will start statin.  Tooth Abscess -F/u with dentist as an OP.    Time spent coordinating care: 35 minutes.   LOS: 1 day   [facility] Pager: [phone] 12/17/2012, 1:13 PM

## 2012-12-17 NOTE — Evaluation (Signed)
Speech Language Pathology Evaluation Patient Details Name: Jason Wells MRN: [id number] DOB: 31-May-1961 Today's Date: 12/17/2012 Time: 4098-1191 SLP Time Calculation (min): 13 min  Problem List:  Patient Active Problem List  Diagnosis  . Tobacco abuse  . Facial palsy  . Sleep apnea  . Tooth impaction  . Other and unspecified hyperlipidemia   Past Medical History:  Past Medical History  Diagnosis Date  . Sleep apnea 8 years ago  . Headache    Past Surgical History: History reviewed. No pertinent past surgical history. HPI:  Jason Wells is a 52 y.o. male who presented to the [facility] with left-sided facial weakness and left upper extremity numbness 3/17. He is on no medications and smokes one pack of cigarettes a day. Patient also reports an impacted left lower jaw wisdom tooth . He is placed on observation to obtain an MRI, for possible CVA vs. Bells Palsy   Assessment / Plan / Recommendation Clinical Impression  Patient presents with no overt cognitive or receptive/expressive language deficits. Patient has severe left facial droop with uknown origin, Bells Palsy vs. CVA (results pending), however exhibits consistently intelligible speech. Pt reports his mouth feels "funny" when he talks but reports no additional speech problems. SLP inquired about pt's questions/concerns and he only reports he wants to know the dx behind his L side droop. No additional f/u needed while in acute setting, pt could possibly benefit from oral motor exercises for dysarthria, althoug still awaiting admitting dx.    SLP Assessment  All further Speech Lanaguage Pathology  needs can be addressed in the next venue of care    Follow Up Recommendations  Outpatient SLP    Frequency and Duration        Pertinent Vitals/Pain None reported   SLP Goals     SLP Evaluation Prior Functioning  Cognitive/Linguistic Baseline: Within functional limits Type of Home: House Lives With:  Spouse Available Help at Discharge: Family;Available PRN/intermittently Vocation: Full time employment   Cognition  Overall Cognitive Status: Appears within functional limits for tasks assessed Arousal/Alertness: Awake/alert Orientation Level: Oriented X4 Awareness: Appears intact Problem Solving: Appears intact Safety/Judgment: Appears intact    Comprehension  Auditory Comprehension Overall Auditory Comprehension: Appears within functional limits for tasks assessed Yes/No Questions: Within Functional Limits Commands: Within Functional Limits Visual Recognition/Discrimination Discrimination: Within Function Limits Reading Comprehension Reading Status: Within funtional limits    Expression Expression Primary Mode of Expression: Verbal Verbal Expression Overall Verbal Expression: Appears within functional limits for tasks assessed Initiation: No impairment Repetition: No impairment Naming: No impairment Pragmatics: No impairment Written Expression Dominant Hand: Right Written Expression: Within Functional Limits   Oral / Motor Oral Motor/Sensory Function Overall Oral Motor/Sensory Function: Impaired Labial ROM: Reduced left Labial Symmetry: Abnormal symmetry left Labial Strength: Reduced Labial Sensation: Reduced Lingual ROM: Within Functional Limits Lingual Symmetry: Within Functional Limits Lingual Strength: Within Functional Limits Lingual Sensation: Within Functional Limits Facial ROM: Reduced left Facial Symmetry: Left droop;Left drooping eyelid Facial Strength: Reduced Facial Sensation: Reduced Mandible: Within Functional Limits Motor Speech Overall Motor Speech: Appears within functional limits for tasks assessed Respiration: Within functional limits Phonation: Normal Resonance: Within functional limits Articulation: Within functional limitis Intelligibility: Intelligible Motor Planning: Witnin functional limits   GO   Berdine Dance SLP student   Berdine Dance 12/17/2012, 1:56 PM

## 2012-12-18 DIAGNOSIS — G510 Bell's palsy: Secondary | ICD-10-CM

## 2012-12-18 DIAGNOSIS — I6350 Cerebral infarction due to unspecified occlusion or stenosis of unspecified cerebral artery: Secondary | ICD-10-CM

## 2012-12-18 DIAGNOSIS — I671 Cerebral aneurysm, nonruptured: Secondary | ICD-10-CM

## 2012-12-18 DIAGNOSIS — R29810 Facial weakness: Secondary | ICD-10-CM

## 2012-12-18 MED ORDER — ASPIRIN 81 MG PO TBEC: 162.0000 mg | DELAYED_RELEASE_TABLET | Freq: Every day | ORAL | Status: DC

## 2012-12-18 MED ORDER — NICOTINE 21 MG/24HR TD PT24
21.0000 mg | MEDICATED_PATCH | Freq: Every day | TRANSDERMAL | Status: DC
  Filled 2012-12-18: qty 1

## 2012-12-18 MED ORDER — ATORVASTATIN CALCIUM 10 MG PO TABS: 40.0000 mg | ORAL_TABLET | Freq: Every day | ORAL | Status: DC

## 2012-12-18 MED ORDER — POLYVINYL ALCOHOL 1.4 % OP SOLN
1.0000 [drp] | OPHTHALMIC | Status: DC
  Filled 2012-12-18: qty 15

## 2012-12-18 MED ORDER — NICOTINE 21 MG/24HR TD PT24: 1.0000 | MEDICATED_PATCH | Freq: Every day | TRANSDERMAL | Status: DC

## 2012-12-18 MED ORDER — ARTIFICIAL TEARS OP OINT
TOPICAL_OINTMENT | Freq: Every day | OPHTHALMIC | Status: DC
  Filled 2012-12-18: qty 3.5

## 2012-12-18 NOTE — Discharge Summary (Signed)
Physician Discharge Summary  Jason Wells GNF:[id number] DOB: 1960/10/12 DOA: 12/16/2012  PCP: No primary provider on file.  Admit date: 12/16/2012 Discharge date: 12/18/2012  Time spent: 30 minutes  Recommendations for Outpatient Follow-up:  1. Follow up with PCP.  Discharge Diagnoses:  Active Problems:   Tobacco abuse   Sleep apnea   Tooth impaction   Other and unspecified hyperlipidemia   Aneurysm, cerebral, nonruptured   CVA (cerebral infarction)   Discharge Condition: stable  Diet recommendation: heart healthy diet  Filed Weights   12/16/12 1331 12/16/12 1843  Weight: 108.863 kg (240 lb) 115.395 kg (254 lb 6.4 oz)    History of present illness:  52 y.o. male with ongoing tobacco abuse presented to the med [facility] with left-sided facial weakness. He also reports left upper extremity numbness. Denies any speech difficulty, denies difficulty ambulating, denies any acute intercurrent illnesses. He is on no medications and smokes one pack of cigarettes a day. Patient also reports an impacted left lower jaw wisdom tooth . He is placed on observation to obtain an MRI   Hospital Course:  Left Facial palsy:  - Initially thought to be a Bell's Palsy.  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  - neurology evaluated recommended ASA  - ECHO no wall motion, normal EF.  - statin.  - smoking cessation counseling. Started nicotine patch. - out patient speech therapy - follow up with PCP in Blood pressure if persistently high will need medications.  Procedures: ECHO: Systolic function was normal. The estimated ejection fraction was in the range of 55% to 60%. Wall motion was normal; there were no regional wall motion abnormalities.  MRI acute right infarct.  Consultations:  neurology  Discharge Exam: Filed Vitals:   12/17/12 2152 12/18/12 0142 12/18/12 0541 12/18/12 1000  BP: 133/80 115/72 108/75 142/95  Pulse: 58 63 56 59  Temp:  97.6 F (36.4 C) 98.1 F (36.7 C) 97.2 F (36.2 C) 97.6 F (36.4 C)  TempSrc: Oral Oral Oral Oral  Resp: 17 18 17 18   Height:      Weight:      SpO2: 96% 97% 97% 98%    See progress note  Discharge Instructions      Discharge Orders   Future Orders Complete By Expires     Diet - low sodium heart healthy  As directed     Increase activity slowly  As directed         Medication List    TAKE these medications       aspirin 81 MG EC tablet  Take 2 tablets (162 mg total) by mouth daily. Swallow whole.     atorvastatin 10 MG tablet  Commonly known as:  LIPITOR  Take 4 tablets (40 mg total) by mouth daily at 6 PM.     MULTIVITAMIN PO  Take 1 tablet by mouth daily.     naproxen sodium 220 MG tablet  Commonly known as:  ANAPROX  Take 440 mg by mouth 2 (two) times daily as needed (for pain).     nicotine 21 mg/24hr patch  Commonly known as:  NICODERM CQ - dosed in mg/24 hours  Place 1 patch onto the skin daily.       Follow-up Information   Follow up In 2 weeks. (hospital follow up with PCP)        The results of significant diagnostics from this hospitalization (including imaging, microbiology, ancillary and laboratory) are listed  below for reference.    Significant Diagnostic Studies: Dg Orthopantogram  12/16/2012  *RADIOLOGY REPORT*  Clinical Data: Left-sided jaw pain.  ORTHOPANTOGRAM/PANORAMIC  Comparison: None.  Findings: Periapical lucency surrounding the root of the second left mandibular molar (tooth #18).  Tooth bud or residual tooth material in the left maxilla related to the third molar (tooth #16).  Absent central incisors in the mandible and absent lateral incisors in the maxilla.  IMPRESSION: Periapical lucency surrounding the root of tooth #18 in the left mandible, possibly an abscess.  Residual tooth material in the posterior left maxilla (tooth #16).   Original Report Authenticated By: Hulan Saas, M.D.    Ct Head Wo Contrast  12/16/2012   *RADIOLOGY REPORT*  Clinical Data: Left-sided facial droop.  Numbness of the face and left arm for 1 day.  CT HEAD WITHOUT CONTRAST  Technique:  Contiguous axial images were obtained from the base of the skull through the vertex without contrast.  Comparison:  None.  Findings:  There is no evidence for acute infarction, intracranial hemorrhage, mass lesion, hydrocephalus, or extra-axial fluid. There is no atrophy or white matter disease.  Calvarium is intact. No significant vascular calcification.  Negative orbits, sinuses, and mastoids.  IMPRESSION:  Negative exam.   Original Report Authenticated By: Davonna Belling, M.D.    Mr Brain Wo Contrast  12/17/2012  *RADIOLOGY REPORT*  Clinical Data:  Left-sided facial droop.  Numbness face and left arm. Smoker.  MRI BRAIN WITHOUT CONTRAST MRA HEAD WITHOUT CONTRAST  Technique: Multiplanar, multiecho pulse sequences of the brain and surrounding structures were obtained according to standard protocol without intravenous contrast.  Angiographic images of the head were obtained using MRA technique without contrast.  Comparison: 12/16/2012 CT.  No comparison MR.  MRI HEAD  Findings:  Questionable tiny acute non hemorrhagic infarct posterior aspect of the posterior limb of the right internal capsule adjacent to the right atrium (series 4 image 16).  No intracranial hemorrhage.  No intracranial mass lesion detected on this unenhanced exam.  Very mild nonspecific white matter type changes most notable right frontal lobe may reflect changes of small vessel disease.  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  Primary mucosal abnormality not entirely excluded although a secondary consideration.  No secondary findings of eustachian tube dysfunction as the mastoid air cells and middle ear cavities are clear.  Nonspecific right cheek 1.3 x 0.9 x 0.8 cm structure.  Minimal mucosal  thickening paranasal sinuses with polypoid opacification right maxillary sinus.  Major intracranial vascular structures are patent.  IMPRESSION: Questionable tiny acute non hemorrhagic infarct posterior aspect of the posterior limb of the right internal capsule adjacent to the right atrium.  Please see above for additional findings  MRA HEAD  Findings: Anterior circulation without medium or large size vessel significant stenosis or occlusion.  Mild narrowing A1 segment right anterior cerebral artery.  Very mild narrowing M1 segment right middle cerebral artery.  Mild ectasia of the vertebral arteries and basilar artery without high-grade stenosis.  Nonvisualization left AICA.  Mild irregularity superior cerebellar arteries and posterior cerebral arteries.  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  Please see above.  This has been made a PRA call report utilizing dashboard call feature.   Original Report Authenticated By: Lacy Duverney, M.D.    Mr Mra Head/brain Wo Cm  12/17/2012  *  RADIOLOGY REPORT*  Clinical Data:  Left-sided facial droop.  Numbness face and left arm. Smoker.  MRI BRAIN WITHOUT CONTRAST MRA HEAD WITHOUT CONTRAST  Technique: Multiplanar, multiecho pulse sequences of the brain and surrounding structures were obtained according to standard protocol without intravenous contrast.  Angiographic images of the head were obtained using MRA technique without contrast.  Comparison: 12/16/2012 CT.  No comparison MR.  MRI HEAD  Findings:  Questionable tiny acute non hemorrhagic infarct posterior aspect of the posterior limb of the right internal capsule adjacent to the right atrium (series 4 image 16).  No intracranial hemorrhage.  No intracranial mass lesion detected on this unenhanced exam.  Very mild nonspecific white matter type changes most notable right frontal lobe may reflect changes of small vessel disease.   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  Primary mucosal abnormality not entirely excluded although a secondary consideration.  No secondary findings of eustachian tube dysfunction as the mastoid air cells and middle ear cavities are clear.  Nonspecific right cheek 1.3 x 0.9 x 0.8 cm structure.  Minimal mucosal thickening paranasal sinuses with polypoid opacification right maxillary sinus.  Major intracranial vascular structures are patent.  IMPRESSION: Questionable tiny acute non hemorrhagic infarct posterior aspect of the posterior limb of the right internal capsule adjacent to the right atrium.  Please see above for additional findings  MRA HEAD  Findings: Anterior circulation without medium or large size vessel significant stenosis or occlusion.  Mild narrowing A1 segment right anterior cerebral artery.  Very mild narrowing M1 segment right middle cerebral artery.  Mild ectasia of the vertebral arteries and basilar artery without high-grade stenosis.  Nonvisualization left AICA.  Mild irregularity superior cerebellar arteries and posterior cerebral arteries.  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  Please see above.  This has been made a PRA call report utilizing dashboard call feature.   Original Report Authenticated By: Lacy Duverney, M.D.     Microbiology: No results found for this or any previous visit (from the past 240 hour(s)).   Labs: Basic Metabolic Panel:  Recent Labs Lab 12/16/12 1410  NA 141  K 3.8  CL 105  CO2 26  GLUCOSE 108*  BUN 19  CREATININE 0.90  CALCIUM 9.4   Liver Function Tests: No results found for this basename: AST, ALT, ALKPHOS, BILITOT, PROT, ALBUMIN,  in the last 168 hours No results found for this basename: LIPASE, AMYLASE,  in the last 168 hours No  results found for this basename: AMMONIA,  in the last 168 hours CBC:  Recent Labs Lab 12/16/12 1410  WBC 5.1  NEUTROABS 2.6  HGB 15.4  HCT 44.3  MCV 86.5  PLT 147*   Cardiac Enzymes: No results found for this basename: CKTOTAL, CKMB, CKMBINDEX, TROPONINI,  in the last 168 hours BNP: BNP (last 3 results) No results found for this basename: PROBNP,  in the last 8760 hours CBG: No results found for this basename: GLUCAP,  in the last 168 hours     Signed:  Marinda Elk  [facility] 12/18/2012, 1:55 PM

## 2012-12-18 NOTE — Progress Notes (Signed)
Stroke Team Progress Note  HISTORY Jason Wells is an 52 y.o. male with no past medical history other than tobacco abuse. Patient does not take ASA on a a daily basis. He noted on Suday afternoon 12/15/2012 he had a left facial droop, along with left shoulder pain and decreased sensation along left arm. He presented to the ED 12/16/2012, neuro saw 12/17/2012 where his left arm symptoms have resolved (he often will have bilateral arm tingling when he awakens from sleep). He continues to have a full left sided facial droop and inability to fully shut his left eyelids. HE denies any hyperacusis of left ear or any decreased taste on left tongue. Patient was not a TPA candidate secondary to delay in arrival. He was admitted for further evaluation and treatment.  SUBJECTIVE His wife is at the bedside.  Overall he feels his condition is stable. He feels he is lisping. He reports he did not come to the hospital at sx onset due to "hard headedness".  OBJECTIVE Most recent Vital Signs: Filed Vitals:   12/17/12 1800 12/17/12 2152 12/18/12 0142 12/18/12 0541  BP: 128/81 133/80 115/72 108/75  Pulse: 64 58 63 56  Temp: 98.3 F (36.8 C) 97.6 F (36.4 C) 98.1 F (36.7 C) 97.2 F (36.2 C)  TempSrc: Oral Oral Oral Oral  Resp: 18 17 18 17   Height:      Weight:      SpO2: 100% 96% 97% 97%   CBG (last 3)  No results found for this basename: GLUCAP,  in the last 72 hours  IV Fluid Intake:     MEDICATIONS  . aspirin  300 mg Rectal Daily   Or  . aspirin  325 mg Oral Daily  . atorvastatin  10 mg Oral q1800  . enoxaparin (LOVENOX) injection  40 mg Subcutaneous Q24H   PRN:  senna-docusate  Diet:  General thin liquids Activity:  OOB with assistance DVT Prophylaxis:  Lovenox 40 mg sq daily   CLINICALLY SIGNIFICANT STUDIES Basic Metabolic Panel:  Recent Labs Lab 12/16/12 1410  NA 141  K 3.8  CL 105  CO2 26  GLUCOSE 108*  BUN 19  CREATININE 0.90  CALCIUM 9.4   Liver Function Tests: No  results found for this basename: AST, ALT, ALKPHOS, BILITOT, PROT, ALBUMIN,  in the last 168 hours CBC:  Recent Labs Lab 12/16/12 1410  WBC 5.1  NEUTROABS 2.6  HGB 15.4  HCT 44.3  MCV 86.5  PLT 147*   Coagulation: No results found for this basename: LABPROT, INR,  in the last 168 hours Cardiac Enzymes: No results found for this basename: CKTOTAL, CKMB, CKMBINDEX, TROPONINI,  in the last 168 hours Urinalysis: No results found for this basename: COLORURINE, APPERANCEUR, LABSPEC, PHURINE, GLUCOSEU, HGBUR, BILIRUBINUR, KETONESUR, PROTEINUR, UROBILINOGEN, NITRITE, LEUKOCYTESUR,  in the last 168 hours Lipid Panel    Component Value Date/Time   CHOL 246* 12/17/2012 0545   TRIG 221* 12/17/2012 0545   HDL 30* 12/17/2012 0545   CHOLHDL 8.2 12/17/2012 0545   VLDL 44* 12/17/2012 0545   LDLCALC 172* 12/17/2012 0545   HgbA1C  Lab Results  Component Value Date   HGBA1C 6.9* 12/17/2012    Urine Drug Screen:   No results found for this basename: labopia, cocainscrnur, labbenz, amphetmu, thcu, labbarb    Alcohol Level: No results found for this basename: ETH,  in the last 168 hours  Dg Orthopantogram 12/16/2012   Periapical lucency surrounding the root of tooth #18 in the left mandible,  possibly an abscess.  Residual tooth material in the posterior left maxilla (tooth #16).     CT of the brain  12/16/2012   Negative exam.    MRI of the brain  Questionable tiny acute non hemorrhagic infarct posterior aspect of the posterior limb of the right internal capsule adjacent to the right atrium.   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    2D Echocardiogram  EF 55-60% with no source of embolus.   Carotid Doppler  No evidence of hemodynamically significant internal carotid artery stenosis. Vertebral artery flow is antegrade.   EKG  normal sinus rhythm.   Therapy Recommendations no PT or OT followup recommended  Physical Exam   Pleasant middle aged caucasian  male not in distress.Awake alert. Afebrile. Head is nontraumatic. Neck is supple without bruit. Hearing is normal. Cardiac exam no murmur or gallop. Lungs are clear to auscultation. Distal pulses are well felt. Neurological Exam ;  Awake alert oriented x 3 normal speech and language. Mild left upper face and moderate left lower face weakness.Marland Kitchenextraocular moments are full range without nystagmus. Vision acuity and fields appear normal. Fundi were not visualized. Tongue midline. No drift. Mild diminished fine finger movements on left. Orbits right over left upper extremity. Mild left grip weak.. Normal sensation . Normal coordination. ;  ASSESSMENT Mr. Jason Wells is a 52 y.o. male who had left facial weakness that began Sat night. Sunday had a headache and left arm numbness, recurred on Monday. Imaging confirms a right PLIC infarct. Infarct felt to be thrombotic secondary to small vessel disease.   On no antiplatelets prior to admission. Now on aspirin 325 mg orally every day for secondary stroke prevention. Patient with resultant left hemisensory deficit that has resolved, partial left upper and left lower facial weakness (unusual, but can be associated with given stroke),  . Work up completed.  Hyperlipidemia, LDL 172, not on statin PTA, difficulty with zocor in the past, on lipitor now,  goal LDL < 100 Cigarette smoker HgbA1c 6.9 Obesity, Body mass index is 35.5 kg/(m^2).   Hospital day # 2  TREATMENT/PLAN  Continue aspirin 325 mg orally every day for secondary stroke prevention.  Stop smoking. Consider Wellbutrin. Patient did poorly with Chantix. Needs close follow up  artifical tears every 1-2 hours in right eye while awake. Lacrilube at bedtime. May need to patch eye to prevent corneal scarring.  Dr. Pearlean Brownie discussed diagnosis, prognosis and plan of care with patient, wife and Dr. Abe People.  Annie Main, MSN, RN, ANVP-BC, ANP-BC, [facility] Pager:  [phone] 12/18/2012 10:02 AM  I have personally obtained a history, examined the patient, evaluated imaging results, and formulated the assessment and plan of care. I agree with the above. Delia Heady, MD

## 2012-12-18 NOTE — Progress Notes (Signed)
Addendum to OT eval 01/17/13   2012-12-27 1500  OT G-codes **NOT FOR INPATIENT CLASS**  Functional Limitation Self care  Self Care Current Status [phone]) [facility]  Self Care Discharge Status [phone]) [facility]   Jeani Hawking, OTR/L [phone]

## 2012-12-18 NOTE — Care Management Note (Signed)
    Page 1 of 1   12/18/2012     2:38:26 PM   CARE MANAGEMENT NOTE 12/18/2012  Patient:  Jason Wells,Jason Wells   Account Number:  [id number]  Date Initiated:  12/17/2012  Documentation initiated by:  Jacquelynn Cree  Subjective/Objective Assessment:   admitted with  facial droop     Action/Plan:   PT/OT eval- no follow up recommended  ST-recommended outpatient ST   Anticipated DC Date:  12/18/2012   Anticipated DC Plan:  HOME/SELF CARE      DC Planning Services  CM consult  OP Neuro Rehab      Choice offered to / List presented to:             Status of service:  Completed, signed off Medicare Important Message given?   (If response is "NO", the following Medicare IM given date fields will be blank) Date Medicare IM given:   Date Additional Medicare IM given:    Discharge Disposition:  HOME/SELF CARE  Per UR Regulation:  Reviewed for med. necessity/level of care/duration of stay  If discussed at Long Length of Stay Meetings, dates discussed:    Comments:  12/18/12 Spoke with patient about outpatient ST, is agreeable to therapy at the [facility]. Gave him map and phone # for the [facility].Explained that they will call the patient to make an appt.Faxed a referral, the order, ST note and d/c summary to [phone], received confirmation. Spoke with patient about how to find a PCP through his insurance. Jacquelynn Cree RN, BSN, CCM

## 2012-12-18 NOTE — Progress Notes (Signed)
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  - neurology evaluated recommended ASA - ECHO no wall motion, normal EF. - statin. - smoking cessation counseling. - out patient speech therapy    Code Status: Full code  Family Communication: Wife in room  Disposition Plan: home   Consultants:  Neuro  Procedures: ECHO: Systolic function was normal. The estimated ejection fraction was in the range of 55% to 60%. Wall motion was normal; there were no regional wall motion abnormalities.   Antibiotics:  none (indicate start date, and stop date if known)  HPI/Subjective: No complains wants to go home  Objective: Filed Vitals:   12/17/12 2152 12/18/12 0142 12/18/12 0541 12/18/12 1000  BP: 133/80 115/72 108/75 142/95  Pulse: 58 63 56 59  Temp: 97.6 F (36.4 C) 98.1 F (36.7 C) 97.2 F (36.2 C) 97.6 F (36.4 C)  TempSrc: Oral Oral Oral Oral  Resp: 17 18 17 18   Height:      Weight:      SpO2: 96% 97% 97% 98%    Intake/Output Summary (Last 24 hours) at 12/18/12 1326 Last data filed at 12/18/12 0700  Gross per 24 hour  Intake    360 ml  Output      0 ml  Net    360 ml   Filed Weights   12/16/12 1331 12/16/12 1843  Weight: 108.863 kg (240 lb) 115.395 kg (254 lb 6.4 oz)    Exam:  General: Alert, awake, oriented x3, in no acute distress.  HEENT: No bruits, no goiter.  Heart: Regular rate and rhythm, without murmurs, rubs, gallops.  Lungs: Good air movement, bilateral air movement.  Abdomen: Soft, nontender, nondistended, positive bowel sounds.  Neuro: facial droop.   Data Reviewed: Basic Metabolic Panel:  Recent Labs Lab 12/16/12 1410  NA 141  K 3.8  CL 105  CO2 26  GLUCOSE 108*  BUN 19  CREATININE 0.90  CALCIUM 9.4   Liver Function Tests: No results found for this basename: AST, ALT, ALKPHOS, BILITOT, PROT,  ALBUMIN,  in the last 168 hours No results found for this basename: LIPASE, AMYLASE,  in the last 168 hours No results found for this basename: AMMONIA,  in the last 168 hours CBC:  Recent Labs Lab 12/16/12 1410  WBC 5.1  NEUTROABS 2.6  HGB 15.4  HCT 44.3  MCV 86.5  PLT 147*   Cardiac Enzymes: No results found for this basename: CKTOTAL, CKMB, CKMBINDEX, TROPONINI,  in the last 168 hours BNP (last 3 results) No results found for this basename: PROBNP,  in the last 8760 hours CBG: No results found for this basename: GLUCAP,  in the last 168 hours  No results found for this or any previous visit (from the past 240 hour(s)).   Studies: Dg Orthopantogram  12/16/2012  *RADIOLOGY REPORT*  Clinical Data: Left-sided jaw pain.  ORTHOPANTOGRAM/PANORAMIC  Comparison: None.  Findings: Periapical lucency surrounding the root of the second left mandibular molar (tooth #18).  Tooth bud or residual tooth material in the left maxilla related to the third molar (tooth #16).  Absent central incisors in the mandible and absent lateral incisors in the maxilla.  IMPRESSION: Periapical lucency surrounding the root of tooth #18 in the left mandible, possibly an abscess.  Residual tooth material in the posterior left maxilla (tooth #16).   Original Report Authenticated By:  Hulan Saas, M.D.    Ct Head Wo Contrast  12/16/2012  *RADIOLOGY REPORT*  Clinical Data: Left-sided facial droop.  Numbness of the face and left arm for 1 day.  CT HEAD WITHOUT CONTRAST  Technique:  Contiguous axial images were obtained from the base of the skull through the vertex without contrast.  Comparison:  None.  Findings:  There is no evidence for acute infarction, intracranial hemorrhage, mass lesion, hydrocephalus, or extra-axial fluid. There is no atrophy or white matter disease.  Calvarium is intact. No significant vascular calcification.  Negative orbits, sinuses, and mastoids.  IMPRESSION:  Negative exam.   Original Report  Authenticated By: Davonna Belling, M.D.    Mr Brain Wo Contrast  12/17/2012  *RADIOLOGY REPORT*  Clinical Data:  Left-sided facial droop.  Numbness face and left arm. Smoker.  MRI BRAIN WITHOUT CONTRAST MRA HEAD WITHOUT CONTRAST  Technique: Multiplanar, multiecho pulse sequences of the brain and surrounding structures were obtained according to standard protocol without intravenous contrast.  Angiographic images of the head were obtained using MRA technique without contrast.  Comparison: 12/16/2012 CT.  No comparison MR.  MRI HEAD  Findings:  Questionable tiny acute non hemorrhagic infarct posterior aspect of the posterior limb of the right internal capsule adjacent to the right atrium (series 4 image 16).  No intracranial hemorrhage.  No intracranial mass lesion detected on this unenhanced exam.  Very mild nonspecific white matter type changes most notable right frontal lobe may reflect changes of small vessel disease.  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  Primary mucosal abnormality not entirely excluded although a secondary consideration.  No secondary findings of eustachian tube dysfunction as the mastoid air cells and middle ear cavities are clear.  Nonspecific right cheek 1.3 x 0.9 x 0.8 cm structure.  Minimal mucosal thickening paranasal sinuses with polypoid opacification right maxillary sinus.  Major intracranial vascular structures are patent.  IMPRESSION: Questionable tiny acute non hemorrhagic infarct posterior aspect of the posterior limb of the right internal capsule adjacent to the right atrium.  Please see above for additional findings  MRA HEAD  Findings: Anterior circulation without medium or large size vessel significant stenosis or occlusion.  Mild narrowing A1 segment right anterior cerebral artery.  Very mild narrowing M1 segment right middle cerebral artery.  Mild ectasia of the  vertebral arteries and basilar artery without high-grade stenosis.  Nonvisualization left AICA.  Mild irregularity superior cerebellar arteries and posterior cerebral arteries.  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  Please see above.  This has been made a PRA call report utilizing dashboard call feature.   Original Report Authenticated By: Lacy Duverney, M.D.    Mr Mra Head/brain Wo Cm  12/17/2012  *RADIOLOGY REPORT*  Clinical Data:  Left-sided facial droop.  Numbness face and left arm. Smoker.  MRI BRAIN WITHOUT CONTRAST MRA HEAD WITHOUT CONTRAST  Technique: Multiplanar, multiecho pulse sequences of the brain and surrounding structures were obtained according to standard protocol without intravenous contrast.  Angiographic images of the head were obtained using MRA technique without contrast.  Comparison: 12/16/2012 CT.  No comparison MR.  MRI HEAD  Findings:  Questionable tiny acute non hemorrhagic infarct posterior aspect of the posterior limb of the right internal capsule adjacent to the right atrium (series 4 image 16).  No intracranial hemorrhage.  No intracranial mass lesion  detected on this unenhanced exam.  Very mild nonspecific white matter type changes most notable right frontal lobe may reflect changes of small vessel disease.  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  Primary mucosal abnormality not entirely excluded although a secondary consideration.  No secondary findings of eustachian tube dysfunction as the mastoid air cells and middle ear cavities are clear.  Nonspecific right cheek 1.3 x 0.9 x 0.8 cm structure.  Minimal mucosal thickening paranasal sinuses with polypoid opacification right maxillary sinus.  Major intracranial vascular structures are patent.  IMPRESSION: Questionable tiny  acute non hemorrhagic infarct posterior aspect of the posterior limb of the right internal capsule adjacent to the right atrium.  Please see above for additional findings  MRA HEAD  Findings: Anterior circulation without medium or large size vessel significant stenosis or occlusion.  Mild narrowing A1 segment right anterior cerebral artery.  Very mild narrowing M1 segment right middle cerebral artery.  Mild ectasia of the vertebral arteries and basilar artery without high-grade stenosis.  Nonvisualization left AICA.  Mild irregularity superior cerebellar arteries and posterior cerebral arteries.  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  Please see above.  This has been made a PRA call report utilizing dashboard call feature.   Original Report Authenticated By: Lacy Duverney, M.D.     Scheduled Meds: . artificial tears   Right Eye QHS  . aspirin  300 mg Rectal Daily   Or  . aspirin  325 mg Oral Daily  . atorvastatin  10 mg Oral q1800  . enoxaparin (LOVENOX) injection  40 mg Subcutaneous Q24H  . polyvinyl alcohol  1 drop Right Eye Q2H   Continuous Infusions:    Marinda Elk  [facility] Pager [phone]. If 8PM-8AM, please contact night-coverage at [email or web address], password [facility] 12/18/2012, 1:26 PM  LOS: 2 days

## 2012-12-18 NOTE — Progress Notes (Signed)
12/17/12 1200  SLP Visit Information  SLP Received On 12/17/12  SLP Time Calculation  SLP Start Time 1155  SLP Stop Time 1208  SLP Time Calculation (min) 13 min  General Information  HPI Jason Wells is a 52 y.o. male who presented to the med [facility] with left-sided facial weakness and left upper extremity numbness 3/17. He is on no medications and smokes one pack of cigarettes a day. Patient also reports an impacted left lower jaw wisdom tooth . He is placed on observation to obtain an MRI, for possible CVA vs. Bells Palsy  Prior Functional Status  Cognitive/Linguistic Baseline WFL  Type of Home House  Lives With Spouse  Available Help at Discharge Family;Available PRN/intermittently  Vocation Full time employment  Cognition  Overall Cognitive Status Appears within functional limits for tasks assessed  Arousal/Alertness Awake/alert  Orientation Level Oriented X4  Awareness Appears intact  Problem Solving Appears intact  Safety/Judgment Appears intact  Auditory Comprehension  Overall Auditory Comprehension Appears within functional limits for tasks assessed  Yes/No Questions WFL  Commands [facility]  Visual Recognition/Discrimination  Discrimination WFL  Reading Comprehension  Reading Status WFL  Expression  Primary Mode of Expression Verbal  Verbal Expression  Overall Verbal Expression Appears within functional limits for tasks assessed  Initiation No impairment  Repetition No impairment  Naming No impairment  Pragmatics No impairment  Written Expression  Dominant Hand Right  Written Expression WFL  Oral Motor/Sensory Function  Overall Oral Motor/Sensory Function Impaired  Labial ROM Reduced left  Labial Symmetry Abnormal symmetry left  Labial Strength Reduced  Labial Sensation Reduced  Lingual ROM WFL  Lingual Symmetry WFL  Lingual Strength WFL  Lingual Sensation WFL  Facial ROM Reduced left  Facial Symmetry Left droop;Left drooping eyelid  Facial  Strength Reduced  Facial Sensation Reduced  Mandible WFL  Motor Speech  Overall Motor Speech Appears within functional limits for tasks assessed  Respiration WFL  Phonation Normal  Resonance [facility]  Articulation WFL  Intelligibility Intelligible  Motor Planning WFL  SLP - End of Session  Activity Tolerance Endurance does not limit participation in activity  Patient left in chair;with call bell/phone within reach  Assessment  Clinical Impression Statement Patient presents with no overt cognitive or receptive/expressive language deficits. Patient has severe left facial droop with uknown origin, Bells Palsy vs. CVA (results pending), however exhibits consistently intelligible speech. Pt reports his mouth feels "funny" when he talks but reports no additional speech problems. SLP inquired about pt's questions/concerns and he only reports he wants to know the dx behind his L side droop. No additional f/u needed while in acute setting, pt could possibly benefit from oral motor exercises for dysarthria, althoug still awaiting admitting dx.  SLP Recommendation/Assessment All further Speech Lanaguage Pathology  needs can be addressed in the next venue of care  SLP Recommendations  Follow up Recommendations Outpatient SLP  SLP Equipment None recommended by SLP  Individuals Consulted  Consulted and Agree with Results and Recommendations Patient  SLP G-Codes **NOT FOR INPATIENT CLASS**  Functional Assessment Tool Used skilled clinical judgement  Functional Limitations Spoken language expressive  Spoken Language Expression Current Status (A5409) CH  Spoken Language Expression Goal Status (W1191) CH  Spoken Language Expression Discharge Status (Y7829) CH  SLP Evaluations  $ SLP Speech Visit 1 Procedure  SLP Evaluations  $ SLP EVAL LANGUAGE/SOUND PRODUCTION 1 Procedure  Ferdinand Lango MA, CCC-SLP [phone]

## 2019-08-07 ENCOUNTER — Inpatient Hospital Stay (HOSPITAL_BASED_OUTPATIENT_CLINIC_OR_DEPARTMENT_OTHER)
Admission: EM | Admit: 2019-08-07 | Discharge: 2019-08-09 | DRG: 247 | Disposition: A | Discharge status: Home / Self Care | Payer: PRIVATE HEALTH INSURANCE | Attending: Cardiology | Admitting: Cardiology

## 2019-08-07 ENCOUNTER — Encounter (HOSPITAL_COMMUNITY)
Admission: EM | Disposition: A | Discharge status: Home / Self Care | Payer: Self-pay | Source: Home / Self Care | Attending: Cardiology

## 2019-08-07 ENCOUNTER — Emergency Department (HOSPITAL_BASED_OUTPATIENT_CLINIC_OR_DEPARTMENT_OTHER): Payer: PRIVATE HEALTH INSURANCE

## 2019-08-07 ENCOUNTER — Encounter (HOSPITAL_BASED_OUTPATIENT_CLINIC_OR_DEPARTMENT_OTHER): Payer: Self-pay

## 2019-08-07 ENCOUNTER — Other Ambulatory Visit: Payer: Self-pay

## 2019-08-07 DIAGNOSIS — I2102 ST elevation (STEMI) myocardial infarction involving left anterior descending coronary artery: Secondary | ICD-10-CM | POA: Diagnosis present

## 2019-08-07 DIAGNOSIS — E785 Hyperlipidemia, unspecified: Secondary | ICD-10-CM | POA: Diagnosis present

## 2019-08-07 DIAGNOSIS — Z720 Tobacco use: Secondary | ICD-10-CM | POA: Diagnosis present

## 2019-08-07 DIAGNOSIS — Z20828 Contact with and (suspected) exposure to other viral communicable diseases: Secondary | ICD-10-CM | POA: Diagnosis present

## 2019-08-07 DIAGNOSIS — E119 Type 2 diabetes mellitus without complications: Secondary | ICD-10-CM | POA: Diagnosis present

## 2019-08-07 DIAGNOSIS — I213 ST elevation (STEMI) myocardial infarction of unspecified site: Secondary | ICD-10-CM

## 2019-08-07 DIAGNOSIS — I2109 ST elevation (STEMI) myocardial infarction involving other coronary artery of anterior wall: Secondary | ICD-10-CM | POA: Diagnosis not present

## 2019-08-07 DIAGNOSIS — Z8673 Personal history of transient ischemic attack (TIA), and cerebral infarction without residual deficits: Secondary | ICD-10-CM

## 2019-08-07 DIAGNOSIS — E7800 Pure hypercholesterolemia, unspecified: Secondary | ICD-10-CM | POA: Diagnosis present

## 2019-08-07 DIAGNOSIS — R079 Chest pain, unspecified: Secondary | ICD-10-CM | POA: Diagnosis not present

## 2019-08-07 DIAGNOSIS — I2510 Atherosclerotic heart disease of native coronary artery without angina pectoris: Secondary | ICD-10-CM | POA: Diagnosis present

## 2019-08-07 DIAGNOSIS — G4733 Obstructive sleep apnea (adult) (pediatric): Secondary | ICD-10-CM | POA: Diagnosis present

## 2019-08-07 DIAGNOSIS — I472 Ventricular tachycardia: Secondary | ICD-10-CM | POA: Diagnosis not present

## 2019-08-07 DIAGNOSIS — Z955 Presence of coronary angioplasty implant and graft: Secondary | ICD-10-CM

## 2019-08-07 DIAGNOSIS — F17210 Nicotine dependence, cigarettes, uncomplicated: Secondary | ICD-10-CM | POA: Diagnosis present

## 2019-08-07 DIAGNOSIS — I517 Cardiomegaly: Secondary | ICD-10-CM | POA: Diagnosis present

## 2019-08-07 DIAGNOSIS — G4730 Sleep apnea, unspecified: Secondary | ICD-10-CM | POA: Diagnosis present

## 2019-08-07 HISTORY — DX: Atherosclerotic heart disease of native coronary artery without angina pectoris: I25.10

## 2019-08-07 HISTORY — PX: LEFT HEART CATH AND CORONARY ANGIOGRAPHY: CATH118249

## 2019-08-07 HISTORY — PX: CORONARY/GRAFT ACUTE MI REVASCULARIZATION: CATH118305

## 2019-08-07 HISTORY — DX: Bell's palsy: G51.0

## 2019-08-07 LAB — TROPONIN I (HIGH SENSITIVITY)
Troponin I (High Sensitivity): 45 ng/L — ABNORMAL HIGH (ref <18)
Troponin I (High Sensitivity): 25508 ng/L (ref <18)
Troponin I (High Sensitivity): >27000 ng/L (ref <18)

## 2019-08-07 LAB — LIPID PANEL
Cholesterol: 314 mg/dL — ABNORMAL HIGH (ref 0–200)
HDL: 39 mg/dL — ABNORMAL LOW (ref >40)
LDL Cholesterol: 225 mg/dL — ABNORMAL HIGH (ref 0–99)
Total CHOL/HDL Ratio: 8.1 RATIO
Triglycerides: 251 mg/dL — ABNORMAL HIGH (ref <150)
VLDL: 50 mg/dL — ABNORMAL HIGH (ref 0–40)

## 2019-08-07 LAB — GLUCOSE, CAPILLARY: Glucose-Capillary: 158 mg/dL — ABNORMAL HIGH (ref 70–99)

## 2019-08-07 LAB — BASIC METABOLIC PANEL
Anion gap: 13 (ref 5–15)
BUN: 18 mg/dL (ref 6–20)
CO2: 22 mmol/L (ref 22–32)
Calcium: 9.1 mg/dL (ref 8.9–10.3)
Chloride: 102 mmol/L (ref 98–111)
Creatinine, Ser: 1.06 mg/dL (ref 0.61–1.24)
GFR calc Af Amer: >60 mL/min (ref >60)
GFR calc non Af Amer: >60 mL/min (ref >60)
Glucose, Bld: 189 mg/dL — ABNORMAL HIGH (ref 70–99)
Potassium: 3.4 mmol/L — ABNORMAL LOW (ref 3.5–5.1)
Sodium: 137 mmol/L (ref 135–145)

## 2019-08-07 LAB — HEMOGLOBIN A1C
Hgb A1c MFr Bld: 8.7 % — ABNORMAL HIGH (ref 4.8–5.6)
Mean Plasma Glucose: 202.99 mg/dL

## 2019-08-07 LAB — MAGNESIUM: Magnesium: 2 mg/dL (ref 1.7–2.4)

## 2019-08-07 LAB — CREATININE, SERUM
Creatinine, Ser: 0.93 mg/dL (ref 0.61–1.24)
GFR calc Af Amer: >60 mL/min (ref >60)
GFR calc non Af Amer: >60 mL/min (ref >60)

## 2019-08-07 LAB — CBC
HCT: 47.2 % (ref 39.0–52.0)
HCT: 43.1 % (ref 39.0–52.0)
Hemoglobin: 16.2 g/dL (ref 13.0–17.0)
Hemoglobin: 15.3 g/dL (ref 13.0–17.0)
MCH: 30.6 pg (ref 26.0–34.0)
MCH: 30.8 pg (ref 26.0–34.0)
MCHC: 34.3 g/dL (ref 30.0–36.0)
MCHC: 35.5 g/dL (ref 30.0–36.0)
MCV: 89.2 fL (ref 80.0–100.0)
MCV: 86.7 fL (ref 80.0–100.0)
Platelets: 134 10*3/uL — ABNORMAL LOW (ref 150–400)
Platelets: 125 10*3/uL — ABNORMAL LOW (ref 150–400)
RBC: 5.29 MIL/uL (ref 4.22–5.81)
RBC: 4.97 MIL/uL (ref 4.22–5.81)
RDW: 12.8 % (ref 11.5–15.5)
RDW: 12.7 % (ref 11.5–15.5)
WBC: 6.1 10*3/uL (ref 4.0–10.5)
WBC: 7.4 10*3/uL (ref 4.0–10.5)
nRBC: 0 % (ref 0.0–0.2)
nRBC: 0 % (ref 0.0–0.2)

## 2019-08-07 LAB — HEPATIC FUNCTION PANEL
ALT: 58 U/L — ABNORMAL HIGH (ref 0–44)
AST: 113 U/L — ABNORMAL HIGH (ref 15–41)
Albumin: 3.4 g/dL — ABNORMAL LOW (ref 3.5–5.0)
Alkaline Phosphatase: 92 U/L (ref 38–126)
Bilirubin, Direct: 0.2 mg/dL (ref 0.0–0.2)
Indirect Bilirubin: 1 mg/dL — ABNORMAL HIGH (ref 0.3–0.9)
Total Bilirubin: 1.2 mg/dL (ref 0.3–1.2)
Total Protein: 6.7 g/dL (ref 6.5–8.1)

## 2019-08-07 LAB — APTT: aPTT: 33 seconds (ref 24–36)

## 2019-08-07 LAB — PROTIME-INR
INR: 1 (ref 0.8–1.2)
Prothrombin Time: 13.4 seconds (ref 11.4–15.2)

## 2019-08-07 LAB — SARS CORONAVIRUS 2 (TAT 6-24 HRS): SARS Coronavirus 2: NEGATIVE

## 2019-08-07 SURGERY — CORONARY/GRAFT ACUTE MI REVASCULARIZATION
Anesthesia: LOCAL

## 2019-08-07 MED ORDER — SODIUM CHLORIDE 0.9 % IV SOLN: INTRAVENOUS | Status: DC

## 2019-08-07 MED ORDER — TIROFIBAN (AGGRASTAT) BOLUS VIA INFUSION
INTRAVENOUS | Status: DC | PRN
  Administered 2019-08-07: 18:00:00 2902.5 ug via INTRAVENOUS

## 2019-08-07 MED ORDER — NITROGLYCERIN 1 MG/10 ML FOR IR/CATH LAB
INTRA_ARTERIAL | Status: DC | PRN
  Administered 2019-08-07 (×2): 200 ug via INTRACORONARY

## 2019-08-07 MED ORDER — TICAGRELOR 90 MG PO TABS
ORAL_TABLET | ORAL | Status: AC
  Filled 2019-08-07: qty 2

## 2019-08-07 MED ORDER — SODIUM CHLORIDE 0.9 % IV SOLN: 250.0000 mL | INTRAVENOUS | Status: DC | PRN

## 2019-08-07 MED ORDER — SODIUM CHLORIDE 0.9% FLUSH: 3.0000 mL | INTRAVENOUS | Status: DC | PRN

## 2019-08-07 MED ORDER — HEPARIN SODIUM (PORCINE) 1000 UNIT/ML IJ SOLN
INTRAMUSCULAR | Status: AC
  Filled 2019-08-07: qty 1

## 2019-08-07 MED ORDER — TIROFIBAN HCL IN NACL 5-0.9 MG/100ML-% IV SOLN
INTRAVENOUS | Status: AC | PRN
  Administered 2019-08-07 (×2): 0.15 ug/kg/min via INTRAVENOUS

## 2019-08-07 MED ORDER — ONDANSETRON HCL 4 MG/2ML IJ SOLN: 4.0000 mg | Freq: Four times a day (QID) | INTRAMUSCULAR | Status: DC | PRN

## 2019-08-07 MED ORDER — TIROFIBAN HCL IN NACL 5-0.9 MG/100ML-% IV SOLN
INTRAVENOUS | Status: AC
  Filled 2019-08-07: qty 100

## 2019-08-07 MED ORDER — ACETAMINOPHEN 325 MG PO TABS: 650.0000 mg | ORAL_TABLET | ORAL | Status: DC | PRN

## 2019-08-07 MED ORDER — INSULIN ASPART 100 UNIT/ML ~~LOC~~ SOLN
0.0000 [IU] | Freq: Three times a day (TID) | SUBCUTANEOUS | Status: DC
  Administered 2019-08-08 – 2019-08-09 (×3): 2 [IU] via SUBCUTANEOUS

## 2019-08-07 MED ORDER — NITROGLYCERIN 1 MG/10 ML FOR IR/CATH LAB
INTRA_ARTERIAL | Status: AC
  Filled 2019-08-07: qty 10

## 2019-08-07 MED ORDER — INSULIN ASPART 100 UNIT/ML ~~LOC~~ SOLN: 0.0000 [IU] | Freq: Every day | SUBCUTANEOUS | Status: DC

## 2019-08-07 MED ORDER — ASPIRIN 81 MG PO CHEW
243.0000 mg | CHEWABLE_TABLET | Freq: Once | ORAL | Status: AC
  Administered 2019-08-07: 243 mg via ORAL
  Filled 2019-08-07: qty 3

## 2019-08-07 MED ORDER — ATORVASTATIN CALCIUM 80 MG PO TABS
80.0000 mg | ORAL_TABLET | Freq: Every day | ORAL | Status: DC
  Administered 2019-08-08: 80 mg via ORAL
  Filled 2019-08-07: qty 1

## 2019-08-07 MED ORDER — HEPARIN (PORCINE) IN NACL 1000-0.9 UT/500ML-% IV SOLN
INTRAVENOUS | Status: AC
  Filled 2019-08-07: qty 1000

## 2019-08-07 MED ORDER — NICOTINE 14 MG/24HR TD PT24
14.0000 mg | MEDICATED_PATCH | Freq: Every day | TRANSDERMAL | Status: DC
  Administered 2019-08-07 – 2019-08-09 (×3): 14 mg via TRANSDERMAL
  Filled 2019-08-07 (×3): qty 1

## 2019-08-07 MED ORDER — ASPIRIN 81 MG PO CHEW: 81.0000 mg | CHEWABLE_TABLET | Freq: Every day | ORAL | Status: DC

## 2019-08-07 MED ORDER — HEPARIN (PORCINE) IN NACL 1000-0.9 UT/500ML-% IV SOLN
INTRAVENOUS | Status: DC | PRN
  Administered 2019-08-07 (×2): 500 mL

## 2019-08-07 MED ORDER — HEPARIN SODIUM (PORCINE) 1000 UNIT/ML IJ SOLN
INTRAMUSCULAR | Status: DC | PRN
  Administered 2019-08-07: 11000 [IU] via INTRAVENOUS

## 2019-08-07 MED ORDER — TICAGRELOR 90 MG PO TABS
ORAL_TABLET | ORAL | Status: DC | PRN
  Administered 2019-08-07: 180 mg via ORAL

## 2019-08-07 MED ORDER — LIDOCAINE HCL (PF) 1 % IJ SOLN
INTRAMUSCULAR | Status: DC | PRN
  Administered 2019-08-07: 2 mL via INTRADERMAL

## 2019-08-07 MED ORDER — LIDOCAINE HCL (PF) 1 % IJ SOLN
INTRAMUSCULAR | Status: AC
  Filled 2019-08-07: qty 30

## 2019-08-07 MED ORDER — VERAPAMIL HCL 2.5 MG/ML IV SOLN
INTRAVENOUS | Status: DC | PRN
  Administered 2019-08-07: 10 mL via INTRA_ARTERIAL

## 2019-08-07 MED ORDER — NITROGLYCERIN 0.4 MG SL SUBL
0.4000 mg | SUBLINGUAL_TABLET | Freq: Once | SUBLINGUAL | Status: AC
  Administered 2019-08-07: 0.4 mg via SUBLINGUAL
  Filled 2019-08-07: qty 1

## 2019-08-07 MED ORDER — TICAGRELOR 90 MG PO TABS
90.0000 mg | ORAL_TABLET | Freq: Two times a day (BID) | ORAL | Status: DC
  Administered 2019-08-08 – 2019-08-09 (×3): 90 mg via ORAL
  Filled 2019-08-07 (×4): qty 1

## 2019-08-07 MED ORDER — METOPROLOL TARTRATE 25 MG PO TABS
25.0000 mg | ORAL_TABLET | Freq: Two times a day (BID) | ORAL | Status: DC
  Administered 2019-08-07 – 2019-08-09 (×4): 25 mg via ORAL
  Filled 2019-08-07 (×4): qty 1

## 2019-08-07 MED ORDER — SODIUM CHLORIDE 0.9% FLUSH
3.0000 mL | Freq: Two times a day (BID) | INTRAVENOUS | Status: DC
  Administered 2019-08-08 – 2019-08-09 (×3): 3 mL via INTRAVENOUS

## 2019-08-07 MED ORDER — VERAPAMIL HCL 2.5 MG/ML IV SOLN
INTRAVENOUS | Status: AC
  Filled 2019-08-07: qty 2

## 2019-08-07 MED ORDER — SODIUM CHLORIDE 0.9% FLUSH
3.0000 mL | Freq: Once | INTRAVENOUS | Status: DC
  Filled 2019-08-07: qty 3

## 2019-08-07 MED ORDER — TIROFIBAN HCL IN NACL 5-0.9 MG/100ML-% IV SOLN
0.1500 ug/kg/min | INTRAVENOUS | Status: AC
  Administered 2019-08-07 – 2019-08-08 (×4): 0.15 ug/kg/min via INTRAVENOUS
  Filled 2019-08-07 (×3): qty 100

## 2019-08-07 MED ORDER — ASPIRIN 81 MG PO CHEW
81.0000 mg | CHEWABLE_TABLET | Freq: Every day | ORAL | Status: DC
  Administered 2019-08-08 – 2019-08-09 (×2): 81 mg via ORAL
  Filled 2019-08-07 (×2): qty 1

## 2019-08-07 MED ORDER — HEPARIN SODIUM (PORCINE) 5000 UNIT/ML IJ SOLN
4000.0000 [IU] | Freq: Once | INTRAMUSCULAR | Status: AC
  Administered 2019-08-07: 4000 [IU] via INTRAVENOUS
  Filled 2019-08-07: qty 1

## 2019-08-07 MED ORDER — POTASSIUM CHLORIDE ER 10 MEQ PO TBCR
20.0000 meq | EXTENDED_RELEASE_TABLET | Freq: Once | ORAL | Status: AC
  Administered 2019-08-07: 20 meq via ORAL
  Filled 2019-08-07: qty 2

## 2019-08-07 MED ORDER — IOHEXOL 350 MG/ML SOLN
INTRAVENOUS | Status: DC | PRN
  Administered 2019-08-07: 19:00:00 190 mL via INTRA_ARTERIAL

## 2019-08-07 MED ORDER — HYDROMORPHONE HCL 1 MG/ML IJ SOLN
0.5000 mg | Freq: Once | INTRAMUSCULAR | Status: AC
  Administered 2019-08-07: 0.5 mg via INTRAVENOUS
  Filled 2019-08-07: qty 1

## 2019-08-07 MED ORDER — ENOXAPARIN SODIUM 40 MG/0.4ML ~~LOC~~ SOLN
40.0000 mg | SUBCUTANEOUS | Status: DC
  Administered 2019-08-08 – 2019-08-09 (×2): 40 mg via SUBCUTANEOUS
  Filled 2019-08-07 (×2): qty 0.4

## 2019-08-07 MED ORDER — SODIUM CHLORIDE 0.9 % WEIGHT BASED INFUSION
1.0000 mL/kg/h | INTRAVENOUS | Status: AC
  Administered 2019-08-07: 1 mL/kg/h via INTRAVENOUS

## 2019-08-07 SURGICAL SUPPLY — 19 items
BALLN SAPPHIRE 2.25X15 (BALLOONS) ×2
BALLOON SAPPHIRE 2.25X15 (BALLOONS) ×1 IMPLANT
CATH EXTRAC PRONTO 5.5F 138CM (CATHETERS) ×2 IMPLANT
CATH INFINITI 5FR ANG PIGTAIL (CATHETERS) ×2 IMPLANT
CATH INFINITI JR4 5F (CATHETERS) ×2 IMPLANT
CATH VISTA GUIDE 6FR XBLAD3.5 (CATHETERS) ×2 IMPLANT
DEVICE RAD COMP TR BAND LRG (VASCULAR PRODUCTS) ×2 IMPLANT
GLIDESHEATH SLEND SS 6F .021 (SHEATH) ×2 IMPLANT
GUIDEWIRE INQWIRE 1.5J.035X260 (WIRE) ×1 IMPLANT
INQWIRE 1.5J .035X260CM (WIRE) ×2
KIT ENCORE 26 ADVANTAGE (KITS) ×2 IMPLANT
KIT HEART LEFT (KITS) ×2 IMPLANT
PACK CARDIAC CATHETERIZATION (CUSTOM PROCEDURE TRAY) ×2 IMPLANT
STENT RESOLUTE ONYX 5.0X30 (Permanent Stent) ×1 IMPLANT
SYR MEDRAD MARK 7 150ML (SYRINGE) ×1 IMPLANT
TRANSDUCER W/STOPCOCK (MISCELLANEOUS) ×2 IMPLANT
TUBING CIL FLEX 10 FLL-RA (TUBING) ×2 IMPLANT
WIRE ASAHI PROWATER 180CM (WIRE) ×2 IMPLANT
WIRE COUGAR XT STRL 190CM (WIRE) ×2 IMPLANT

## 2019-08-07 NOTE — H&P (Addendum)
Cardiology Admission History and Physical:   Patient ID: Jason Wells MRN: [id number]; DOB: 1961/06/17   Admission date: 08/07/2019  Primary Care Provider: Daylene Posey, [place] Primary Cardiologist: Peter Martinique, MD new Primary Electrophysiologist:  None chest pain   Chief Complaint:  Chest pain, STEMI  Patient Profile:   Jason Wells is a 58 y.o. male with no prior cardiac hx, sleep apnea, + tobacco hx and bell's palsy now presents with acute chest pain to med center [facility]. Acute EKG changes and transferred to [facility] for STEMI   History of Present Illness:   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   Mid sternal with radiation down both arms.  + diaphoresis no nausea no SOB.  EKG with ST  Elevation  V1-V4.  With acute EKG changes pt transferred to [facility] by EMS with code STEMI.   Dr. Martinique has seen and examined.    Troponin 45, Na 137, K+ 3.4, BUN 18, Cr 1.06 glucose elevated 189  Hgb 16.2  plts 134, WBC 6.1  CXR NAD.   EKG:  The ECG that was done  In ER was personally reviewed and demonstrates SR with ST elevation as above.  DR. Audie Box has reviewed and call STEMI.    Home meds do include multivitamin  .no longer on statin, or asa  Echo in 2014 with mild LVH, EF 55-60%. No RWMA  Taken emergently to cath lab. On arrival to cath lab pain only in both arms  BP 174/101 P 85 T 98.3   Has rec'd IV heparin 4,000 u, ASA NTG and dilaudid,  COVID is pending.  Staff using PPE.    Heart Pathway Score:     Past Medical History:  Diagnosis Date  . Bell's palsy   . Facial palsy   . Headache(784.0)   . Sleep apnea 8 years ago    History reviewed. No pertinent surgical history.   Medications Prior to Admission: Prior to Admission medications   Medication Sig Start Date End Date Taking? Authorizing Provider   aspirin 81 MG EC tablet Take 2 tablets (162 mg total) by mouth daily. Swallow whole. 12/18/12   Charlynne Cousins, MD  atorvastatin (LIPITOR) 10 MG tablet Take 4 tablets (40 mg total) by mouth daily at 6 PM. 12/18/12   Charlynne Cousins, MD  Multiple Vitamins-Minerals (MULTIVITAMIN PO) Take 1 tablet by mouth daily.    [provider]  naproxen sodium (ANAPROX) 220 MG tablet Take 440 mg by mouth 2 (two) times daily as needed (for pain).    [provider]  nicotine (NICODERM CQ - DOSED IN MG/24 HOURS) 21 mg/24hr patch Place 1 patch onto the skin daily. 12/18/12   Charlynne Cousins, MD     Allergies:   No Known Allergies  Social History:   Social History   Socioeconomic History  . Marital status: Married    Spouse name: Not on file  . Number of children: Not on file  . Years of education: Not on file  . Highest education level: Not on file  Occupational History  . Not on file  Social Needs  . Financial resource strain: Not on file  . Food insecurity    Worry: Not on file    Inability: Not on file  . Transportation needs    Medical: Not on file  Non-medical: Not on file  Tobacco Use  . Smoking status: Current Every Day Smoker    Packs/day: 1.00    Years: 30.00    Pack years: 30.00    Types: Cigarettes  . Smokeless tobacco: Never Used  Substance and Sexual Activity  . Alcohol use: Yes    Comment: occassionally   . Drug use: No  . Sexual activity: Not on file  Lifestyle  . Physical activity    Days per week: Not on file    Minutes per session: Not on file  . Stress: Not on file  Relationships  . Social Musicianconnections    Talks on phone: Not on file    Gets together: Not on file    Attends religious service: Not on file    Active member of club or organization: Not on file    Attends meetings of clubs or organizations: Not on file    Relationship status: Not on file  . Intimate partner violence    Fear of current or ex partner: Not on file     Emotionally abused: Not on file    Physically abused: Not on file    Forced sexual activity: Not on file  Other Topics Concern  . Not on file  Social History Narrative  . Not on file    Family History:   The patient's family history includes Cancer in his mother.    ROS:  Please see the history of present illness.  General:no colds or fevers, no weight changes Skin:no rashes or ulcers HEENT:no blurred vision, no congestion CV:see HPI PUL:see HPI GI:no diarrhea constipation or melena, no indigestion GU:no hematuria, no dysuria MS:no joint pain, no claudication Neuro:no syncope, no lightheadedness Endo:no diabetes, no thyroid disease All other ROS reviewed and negative.     Physical Exam/Data:   Vitals:   08/07/19 1631 08/07/19 1645 08/07/19 1651 08/07/19 1750  BP:  (!) 186/115 (!) 174/101   Pulse:  79 79   Resp:  18 19   Temp:      TempSrc:      SpO2:  100% 100% 98%  Weight: 116.1 kg     Height:       No intake or output data in the 24 hours ending 08/07/19 1852 Last 3 Weights 08/07/2019 12/16/2012 12/16/2012  Weight (lbs) 256 lb 254 lb 6.4 oz 240 lb  Weight (kg) 116.121 kg 115.395 kg 108.863 kg     Body mass index is 34.72 kg/m.   Per Dr. [place]  General:  Well nourished, well developed, in no acute distress HEENT: normal Lymph: no adenopathy Neck: no JVD Endocrine:  No thryomegaly Vascular: No carotid bruits; FA pulses 2+ bilaterally without bruits  Cardiac:  normal S1, S2; RRR; no murmur gallup rub or click Lungs:  clear to auscultation bilaterally, no wheezing, rhonchi or rales  Abd: soft, nontender, no hepatomegaly  Ext: no lower ext  edema Musculoskeletal:  No deformities, BUE and BLE strength normal and equal Skin: warm and dry  Neuro:  CNs 2-12 intact, no focal abnormalities noted Psych:  Normal affect     Relevant CV Studies: Old Echo normal.   Laboratory Data:  High Sensitivity Troponin:   Recent Labs  Lab 08/07/19 1640  TROPONINIHS 45*       Chemistry Recent Labs  Lab 08/07/19 1650  NA 137  K 3.4*  CL 102  CO2 22  GLUCOSE 189*  BUN 18  CREATININE 1.06  CALCIUM 9.1  GFRNONAA >60  GFRAA >  60  ANIONGAP 13    No results for input(s): PROT, ALBUMIN, AST, ALT, ALKPHOS, BILITOT in the last 168 hours. Hematology Recent Labs  Lab 08/07/19 1650  WBC 6.1  RBC 5.29  HGB 16.2  HCT 47.2  MCV 89.2  MCH 30.6  MCHC 34.3  RDW 12.8  PLT 134*   BNPNo results for input(s): BNP, PROBNP in the last 168 hours.  DDimer No results for input(s): DDIMER in the last 168 hours.   Radiology/Studies:  Dg Chest Port 1 View  Result Date: 08/07/2019 CLINICAL DATA:  CP that started 30 min PTA while mowing the lawn-denies fever/flu like sx. Rt elbow pain .cp EXAM: PORTABLE CHEST 1 VIEW COMPARISON:  None. FINDINGS: Heart size is normal. The lungs are free of focal consolidations and pleural effusions. No pulmonary edema. IMPRESSION: No active disease. Electronically Signed   By: Norva Pavlov M.D.   On: 08/07/2019 17:16    Assessment and Plan:   1. STEMI of ant wall emergently to the cath lab.add BB  2. HLD add high dose statin 3. Hx of CVA no longer on asa and statin. 4. Tobacco use - discussed stopping, need Nicoderm patch 5. Hx of sleep apnea will need to re eval.   Severity of Illness: The appropriate patient status for this patient is INPATIENT. Inpatient status is judged to be reasonable and necessary in order to provide the required intensity of service to ensure the patient's safety. The patient's presenting symptoms, physical exam findings, and initial radiographic and laboratory data in the context of their chronic comorbidities is felt to place them at high risk for further clinical deterioration. Furthermore, it is not anticipated that the patient will be medically stable for discharge from the hospital within 2 midnights of admission. The following factors support the patient status of inpatient.   " The patient's  presenting symptoms include chest pain acute. " The worrisome physical exam findings include pain and diaphoresis. " The initial radiographic and laboratory data are worrisome because of acute ST changes. " The chronic co-morbidities include HLD.   * I certify that at the point of admission it is my clinical judgment that the patient will require inpatient hospital care spanning beyond 2 midnights from the point of admission due to high intensity of service, high risk for further deterioration and high frequency of surveillance required.*    For questions or updates, please contact [facility] Please consult [email or web address] for contact info under        Signed, Nada Boozer, NP  08/07/2019 6:52 PM

## 2019-08-07 NOTE — ED Provider Notes (Signed)
Jason Wells Provider Note MRN:  [id number]  Arrival date & time: 08/07/19     Chief Complaint   Chest Pain   History of Present Illness   Jason Wells is a 58 y.o. year-old male with a history of tobacco abuse presenting to the ED with chief complaint of chest pain.  Sudden onset chest pain 1 hour prior to arrival while patient was mowing the lawn.  Radiates down both arms.  Associated with significant diaphoresis, no nausea, no vomiting, no shortness of breath.  Review of Systems  A complete 10 system review of systems was obtained and all systems are negative except as noted in the HPI and PMH.   Patient's Health History    Past Medical History:  Diagnosis Date  . Bell's palsy   . Facial palsy   . Headache(784.0)   . Sleep apnea 8 years ago    History reviewed. No pertinent surgical history.  Family History  Problem Relation Age of Onset  . Cancer Mother     Social History   Socioeconomic History  . Marital status: Married    Spouse name: Not on file  . Number of children: Not on file  . Years of education: Not on file  . Highest education level: Not on file  Occupational History  . Not on file  Social Needs  . Financial resource strain: Not on file  . Food insecurity    Worry: Not on file    Inability: Not on file  . Transportation needs    Medical: Not on file    Non-medical: Not on file  Tobacco Use  . Smoking status: Current Every Day Smoker    Packs/day: 1.00    Years: 30.00    Pack years: 30.00    Types: Cigarettes  . Smokeless tobacco: Never Used  Substance and Sexual Activity  . Alcohol use: Yes    Comment: occassionally   . Drug use: No  . Sexual activity: Not on file  Lifestyle  . Physical activity    Days per week: Not on file    Minutes per session: Not on file  . Stress: Not on file  Relationships  . Social Herbalist on phone: Not on file    Gets together: Not on  file    Attends religious service: Not on file    Active member of club or organization: Not on file    Attends meetings of clubs or organizations: Not on file    Relationship status: Not on file  . Intimate partner violence    Fear of current or ex partner: Not on file    Emotionally abused: Not on file    Physically abused: Not on file    Forced sexual activity: Not on file  Other Topics Concern  . Not on file  Social History Narrative  . Not on file     Physical Exam  Vital Signs and Nursing Notes reviewed Vitals:   08/07/19 1645 08/07/19 1651  BP: (!) 186/115 (!) 174/101  Pulse: 79 79  Resp: 18 19  Temp:    SpO2: 100% 100%    CONSTITUTIONAL: Well-appearing, NAD NEURO:  Alert and oriented x 3, no focal deficits EYES:  eyes equal and reactive ENT/NECK:  no LAD, no JVD CARDIO: Regular rate, well-perfused, normal S1 and S2 PULM:  CTAB no wheezing or rhonchi GI/GU:  normal bowel sounds, non-distended, non-tender MSK/SPINE:  No gross deformities, no edema  SKIN:  no rash, atraumatic PSYCH:  Appropriate speech and behavior  Diagnostic and Interventional Summary    EKG Interpretation  Date/Time:  Thursday August 07 2019 16:23:14 EST Ventricular Rate:  76 PR Interval:  164 QRS Duration: 84 QT Interval:  380 QTC Calculation: 427 R Axis:   -14 Text Interpretation: Normal sinus rhythm Low voltage QRS STEMI confirmed by Kennis Carina [phone]) on 08/07/2019 4:30:21 PM      Labs Reviewed  SARS CORONAVIRUS 2 (TAT 6-24 HRS)  BASIC METABOLIC PANEL  CBC  PROTIME-INR  APTT  LIPID PANEL  TROPONIN I (HIGH SENSITIVITY)    DG Chest Port 1 View    (Results Pending)    Medications  sodium chloride flush (NS) 0.9 % injection 3 mL (has no administration in time range)  0.9 %  sodium chloride infusion (has no administration in time range)  heparin injection 4,000 Units (has no administration in time range)  nitroGLYCERIN (NITROSTAT) SL tablet 0.4 mg (has no administration in  time range)  aspirin chewable tablet 243 mg (243 mg Oral Given 08/07/19 1644)  HYDROmorphone (DILAUDID) injection 0.5 mg (0.5 mg Intravenous Given 08/07/19 1654)     Procedures  /  Critical Care .Critical Care Performed by: Jason Sous, MD Authorized by: Jason Sous, MD   Critical care provider statement:    Critical care time (minutes):  40   Critical care was necessary to treat or prevent imminent or life-threatening deterioration of the following conditions: ST elevation myocardial infarction.   Critical care was time spent personally by me on the following activities:  Discussions with consultants, evaluation of patient's response to treatment, examination of patient, ordering and performing treatments and interventions, ordering and review of laboratory studies, ordering and review of radiographic studies, pulse oximetry, re-evaluation of patient's condition, obtaining history from patient or surrogate and review of old charts    ED Course and Medical Decision Making  I have reviewed the triage vital signs and the nursing notes.  Pertinent labs & imaging results that were available during my care of the patient were reviewed by me and considered in my medical decision making (see below for details).  Concerning chest pain history in this 58 year old male who infrequently sees doctors, tobacco abuse.  Initial EKG showing some concerning features, small elevations anteriorly with possibly some depressions laterally.  Unsure if greater than 2 mm and therefore qualifying as code STEMI criteria.  Definitely change from prior.  Immediately paged cardiology for a second opinion.  Spoke with Dr. Bufford Buttner of cardiology, who agrees with the concerning features and agrees with calling a code STEMI.  Patient to be given heparin, aspirin, transport on the way.  Spoke with Dr. [place] of interventional cardiology who is aware.   Jason Wells. Pilar Plate, MD [facility] Emergency Medicine [facility] [email or web address]  Final Clinical Impressions(s) / ED Diagnoses     ICD-10-CM   1. ST elevation myocardial infarction (STEMI), unspecified artery (HCC)  I21.3   2. Chest pain  R07.9 DG Chest [facility] 1 View    DG Chest Port 1 View    ED Discharge Orders    None       Discharge Instructions Discussed with and Provided to Patient:   Discharge Instructions   None       Jason Sous, MD 08/07/19 1657

## 2019-08-07 NOTE — ED Notes (Signed)
[facility] at bedside 

## 2019-08-07 NOTE — ED Triage Notes (Addendum)
C/o CP that started 30 min PTA while mowing the lawn-denies fever/flu like sx-NAD-steady gait

## 2019-08-07 NOTE — Progress Notes (Signed)
CRITICAL VALUE ALERT  Critical Value: trop > 25000  Date & Time Notied:  11/5 2100  Provider Notified:DR Coniglio notified  Orders Received/Actions taken: yes

## 2019-08-08 ENCOUNTER — Inpatient Hospital Stay (HOSPITAL_COMMUNITY): Payer: PRIVATE HEALTH INSURANCE

## 2019-08-08 ENCOUNTER — Encounter (HOSPITAL_COMMUNITY): Payer: Self-pay | Admitting: Cardiology

## 2019-08-08 DIAGNOSIS — I2510 Atherosclerotic heart disease of native coronary artery without angina pectoris: Secondary | ICD-10-CM

## 2019-08-08 LAB — BASIC METABOLIC PANEL
Anion gap: 10 (ref 5–15)
BUN: 13 mg/dL (ref 6–20)
CO2: 20 mmol/L — ABNORMAL LOW (ref 22–32)
Calcium: 8.6 mg/dL — ABNORMAL LOW (ref 8.9–10.3)
Chloride: 107 mmol/L (ref 98–111)
Creatinine, Ser: 0.84 mg/dL (ref 0.61–1.24)
GFR calc Af Amer: >60 mL/min (ref >60)
GFR calc non Af Amer: >60 mL/min (ref >60)
Glucose, Bld: 126 mg/dL — ABNORMAL HIGH (ref 70–99)
Potassium: 3.5 mmol/L (ref 3.5–5.1)
Sodium: 137 mmol/L (ref 135–145)

## 2019-08-08 LAB — LIPID PANEL
Cholesterol: 257 mg/dL — ABNORMAL HIGH (ref 0–200)
HDL: 35 mg/dL — ABNORMAL LOW (ref >40)
LDL Cholesterol: 177 mg/dL — ABNORMAL HIGH (ref 0–99)
Total CHOL/HDL Ratio: 7.3 RATIO
Triglycerides: 227 mg/dL — ABNORMAL HIGH (ref <150)
VLDL: 45 mg/dL — ABNORMAL HIGH (ref 0–40)

## 2019-08-08 LAB — GLUCOSE, CAPILLARY
Glucose-Capillary: 152 mg/dL — ABNORMAL HIGH (ref 70–99)
Glucose-Capillary: 182 mg/dL — ABNORMAL HIGH (ref 70–99)
Glucose-Capillary: 172 mg/dL — ABNORMAL HIGH (ref 70–99)
Glucose-Capillary: 161 mg/dL — ABNORMAL HIGH (ref 70–99)
Glucose-Capillary: 156 mg/dL — ABNORMAL HIGH (ref 70–99)

## 2019-08-08 LAB — POCT ACTIVATED CLOTTING TIME
Activated Clotting Time: 466 seconds
Activated Clotting Time: 318 seconds

## 2019-08-08 LAB — CBC
HCT: 43.3 % (ref 39.0–52.0)
Hemoglobin: 15.1 g/dL (ref 13.0–17.0)
MCH: 30.8 pg (ref 26.0–34.0)
MCHC: 34.9 g/dL (ref 30.0–36.0)
MCV: 88.4 fL (ref 80.0–100.0)
Platelets: 131 10*3/uL — ABNORMAL LOW (ref 150–400)
RBC: 4.9 MIL/uL (ref 4.22–5.81)
RDW: 12.8 % (ref 11.5–15.5)
WBC: 7.2 10*3/uL (ref 4.0–10.5)
nRBC: 0 % (ref 0.0–0.2)

## 2019-08-08 LAB — ECHOCARDIOGRAM COMPLETE
Height: 72 in
Weight: 4063.52 oz

## 2019-08-08 LAB — TSH: TSH: 1.368 u[IU]/mL (ref 0.350–4.500)

## 2019-08-08 LAB — MRSA PCR SCREENING: MRSA by PCR: NEGATIVE

## 2019-08-08 LAB — HEMOGLOBIN A1C
Hgb A1c MFr Bld: 8.5 % — ABNORMAL HIGH (ref 4.8–5.6)
Mean Plasma Glucose: 197.25 mg/dL

## 2019-08-08 LAB — TROPONIN I (HIGH SENSITIVITY): Troponin I (High Sensitivity): >27000 ng/L (ref <18)

## 2019-08-08 MED ORDER — LISINOPRIL 5 MG PO TABS
5.0000 mg | ORAL_TABLET | Freq: Every day | ORAL | Status: DC
  Administered 2019-08-08 – 2019-08-09 (×2): 5 mg via ORAL
  Filled 2019-08-08 (×2): qty 1

## 2019-08-08 MED ORDER — CHLORHEXIDINE GLUCONATE CLOTH 2 % EX PADS: 6.0000 | MEDICATED_PAD | Freq: Every day | CUTANEOUS | Status: DC

## 2019-08-08 NOTE — Progress Notes (Signed)
Attempted to call report to floor, nurse unavailable, admitting another patient.

## 2019-08-08 NOTE — Progress Notes (Signed)
CARDIAC REHAB PHASE I   PRE:  Rate/Rhythm: 67 SR    BP: sitting 150/94    SaO2: 97 RA  MODE:  Ambulation: 370 ft   POST:  Rate/Rhythm: 73 SR    BP: sitting 143/96     SaO2:   Tolerated well, feels good. BP elevated. Ed completed with good reception. He wants to quit smoking, sts hes done. Gave resources. Understands importance of Brilinta. We also discussed diet and DM. He is receptive. Will refer to [facility]   Of note, pt is planning to go on vacation Sunday. He wants to go to [place] then to [place] to a cabin for the rest of the week where he will relax. I encouraged him to discuss with MD. I did discourage him from doing the casino part of the trip atleast.   [place], ACSM 08/08/2019 2:31 PM

## 2019-08-08 NOTE — Care Management (Signed)
Per  Jarome Matin W/[facility]. Ph# [phone]. Co-pay amount for Brilinta 90mg . twice a day $39.76,for a 30 day supply. No PA required No deductible Tier 2 medication Retail pharmacies :[facility].

## 2019-08-08 NOTE — Plan of Care (Signed)
  Problem: Cardiovascular: Goal: Ability to achieve and maintain adequate cardiovascular perfusion will improve Outcome: Progressing   Problem: Cardiovascular: Goal: Vascular access site(s) Level 0-1 will be maintained Outcome: Progressing   Problem: Pain Managment: Goal: General experience of comfort will improve Outcome: Progressing   Problem: Elimination: Goal: Will not experience complications related to bowel motility Outcome: Progressing   Problem: Safety: Goal: Ability to remain free from injury will improve Outcome: Progressing   Problem: Education: Goal: Understanding of CV disease, CV risk reduction, and recovery process will improve Outcome: Progressing

## 2019-08-08 NOTE — Progress Notes (Signed)
  Echocardiogram 2D Echocardiogram has been performed.  Jason Wells 08/08/2019, 11:14 AM

## 2019-08-08 NOTE — Progress Notes (Addendum)
Progress Note  Patient Name: Jason Wells Date of Encounter: 08/08/2019  Primary Cardiologist: Peter Martinique, MD   Subjective   Feeling much better today after having come in due to extreme chest pain, sweating, and left arm pain after mowing the lawn. Rest did not relieved it. This has never happened before.  Did have some mild chest pain overnight that only lasted a few minutes but resolved on its own. Denies current CP or SOB.  He has a history of sleep apnea and uses his CPAP at home.   Inpatient Medications    Scheduled Meds: . aspirin  81 mg Oral Daily  . atorvastatin  80 mg Oral q1800  . enoxaparin (LOVENOX) injection  40 mg Subcutaneous Q24H  . insulin aspart  0-5 Units Subcutaneous QHS  . insulin aspart  0-9 Units Subcutaneous TID WC  . metoprolol tartrate  25 mg Oral BID  . nicotine  14 mg Transdermal Daily  . sodium chloride flush  3 mL Intravenous Once  . sodium chloride flush  3 mL Intravenous Q12H  . ticagrelor  90 mg Oral BID   Continuous Infusions: . sodium chloride    . sodium chloride    . sodium chloride 1 mL/kg/hr (08/08/19 0300)  . tirofiban 0.15 mcg/kg/min (08/08/19 0454)   PRN Meds: sodium chloride, acetaminophen, ondansetron (ZOFRAN) IV, sodium chloride flush   Vital Signs    Vitals:   08/08/19 0037 08/08/19 0100 08/08/19 0200 08/08/19 0300  BP:  120/87 125/84   Pulse:      Resp:  (!) 24 18 (!) 23  Temp: 98.2 F (36.8 C)     TempSrc: Oral     SpO2:  95% 98% 98%  Weight:      Height:        Intake/Output Summary (Last 24 hours) at 08/08/2019 1751 Last data filed at 08/08/2019 0300 Gross per 24 hour  Intake 516.46 ml  Output 1000 ml  Net -483.54 ml   Filed Weights   08/07/19 1631  Weight: 116.1 kg    Telemetry    NSR, several episodes non-sustained vtach - Personally Reviewed  ECG    ECG 11/5 - ST elevation V1-V3, left axis deviation  - Personally Reviewed  ECG 11/6 - Q-waves aVF, V1-V2; LAD, resolution of ST elevation   Physical Exam   GEN: No acute distress, appears stated age Neck: No JVD, [place]/AT Cardiac: RRR, no murmurs, rubs, or gallops.  Respiratory: Clear to auscultation bilaterally. GI: Soft, nontender, non-distended  MS: No edema; No deformity. Neuro:  Nonfocal  Psych: Normal affect   Labs    Chemistry Recent Labs  Lab 08/07/19 1650 08/07/19 1938 08/07/19 2218 08/08/19 0304  NA 137  --   --  137  K 3.4*  --   --  3.5  CL 102  --   --  107  CO2 22  --   --  20*  GLUCOSE 189*  --   --  126*  BUN 18  --   --  13  CREATININE 1.06  --  0.93 0.84  CALCIUM 9.1  --   --  8.6*  PROT  --  6.7  --   --   ALBUMIN  --  3.4*  --   --   AST  --  113*  --   --   ALT  --  58*  --   --   ALKPHOS  --  92  --   --  BILITOT  --  1.2  --   --   GFRNONAA >60  --  >60 >60  GFRAA >60  --  >60 >60  ANIONGAP 13  --   --  10     Hematology Recent Labs  Lab 08/07/19 1650 08/07/19 2218 08/08/19 0304  WBC 6.1 7.4 7.2  RBC 5.29 4.97 4.90  HGB 16.2 15.3 15.1  HCT 47.2 43.1 43.3  MCV 89.2 86.7 88.4  MCH 30.6 30.8 30.8  MCHC 34.3 35.5 34.9  RDW 12.8 12.7 12.8  PLT 134* 125* 131*    Cardiac EnzymesNo results for input(s): TROPONINI in the last 168 hours. No results for input(s): TROPIPOC in the last 168 hours.   BNPNo results for input(s): BNP, PROBNP in the last 168 hours.   DDimer No results for input(s): DDIMER in the last 168 hours.   Radiology    Dg Chest Port 1 View  Result Date: 08/07/2019 CLINICAL DATA:  CP that started 30 min PTA while mowing the lawn-denies fever/flu like sx. Rt elbow pain .cp EXAM: PORTABLE CHEST 1 VIEW COMPARISON:  None. FINDINGS: Heart size is normal. The lungs are free of focal consolidations and pleural effusions. No pulmonary edema. IMPRESSION: No active disease. Electronically Signed   By: Norva PavlovElizabeth  Brown M.D.   On: 08/07/2019 17:16    Cardiac Studies    Cath 08/07/19  Mid LAD lesion is 100% stenosed.  1st Diag lesion is 65% stenosed.  The left  ventricular systolic function is normal.  LV end diastolic pressure is normal.  The left ventricular ejection fraction is 50-55% by visual estimate.   1. 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 This vessel is very large and ectatic. Lesion was thrombotic.  2. Good overall LV function with apical HK 3. Normal LVEDP 4. Successful PCI of the mid LAD with thrombectomy and DES x 1.         Patient Profile     Jason ShorterSalvatore Wells is a 58 y.o. male with no prior cardiac hx, TIIDM, sleep apnea, + tobacco hx w/30 pack years and bell's palsy now presents with acute chest pain to med [facility]. Acute EKG changes and [facility]   Assessment & Plan     STEMI Presented with anterior stemi, emergent cath showed 100% LAD stenosis with placement of stent. Also 65% stenosis 1st diagonal. He is feeling much better today. Discussed smoking cessation, which he understands and feels it is time to quit. Discussed healthier diet as well as the importance of medication adherence in the setting of stent placement.  - cont. aggrastat 18 hours total  - cont. DAPT for one year - asa & brilinta - cont. Metop 25 mg bid  - f/u echo - currently normotensive    HLD LDL 177, triglycerides 227. Started on atorva 80. Start lovaza.   TIIDM Undiagnosed. 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 Will need outpatient follow-up for aggressive glucose control. Discussed treatment of diabetes, he is hesitant to start new medications. Would likely benefit form SGLT-2 inhibitor.   Sleep Apnea Uses CPAP machine at home. Ordered CPAP today   For questions or updates, please contact [facility] Please consult [email or web address] for contact info under Cardiology/STEMI.      Signed, Versie StarksJaimie A Seawell, DO  08/08/2019, 6:23 AM    Patient seen and examined. Agree with assessment and plan. Day 1 s/p Anterior MI 2/2 total LAD occlusion s/p thrombectomy and DES stent; still with sluggish flow at completion of procedure;  on aggrastat for 18 hours post procedure. Concomitant diagonal stenosis with initial med Rx.  He has had several episodes of NSVT, now on lopressor 25 mg bid. Will add low dose lisinopril.  2 d echo today. ASA/brillinta for a minimum of one year.  Now on atorvastatin 80 mg. LDL 177. Target LDL < 70. Pt has been on CPAP since 2008; has ResMed S8 elite older CPAP machine. Newly diagnosed DM with HbA1c 8.5.  Discussed smoking cessation with 42 year history.    Lennette Bihari, MD, [facility] 08/08/2019 8:34 AM

## 2019-08-09 ENCOUNTER — Encounter (HOSPITAL_COMMUNITY): Payer: Self-pay | Admitting: Student

## 2019-08-09 LAB — BASIC METABOLIC PANEL
Anion gap: 11 (ref 5–15)
BUN: 14 mg/dL (ref 6–20)
CO2: 20 mmol/L — ABNORMAL LOW (ref 22–32)
Calcium: 9 mg/dL (ref 8.9–10.3)
Chloride: 105 mmol/L (ref 98–111)
Creatinine, Ser: 1.02 mg/dL (ref 0.61–1.24)
GFR calc Af Amer: >60 mL/min (ref >60)
GFR calc non Af Amer: >60 mL/min (ref >60)
Glucose, Bld: 153 mg/dL — ABNORMAL HIGH (ref 70–99)
Potassium: 3.9 mmol/L (ref 3.5–5.1)
Sodium: 136 mmol/L (ref 135–145)

## 2019-08-09 LAB — CBC
HCT: 43.8 % (ref 39.0–52.0)
Hemoglobin: 15.3 g/dL (ref 13.0–17.0)
MCH: 30.8 pg (ref 26.0–34.0)
MCHC: 34.9 g/dL (ref 30.0–36.0)
MCV: 88.1 fL (ref 80.0–100.0)
Platelets: 125 10*3/uL — ABNORMAL LOW (ref 150–400)
RBC: 4.97 MIL/uL (ref 4.22–5.81)
RDW: 12.9 % (ref 11.5–15.5)
WBC: 7.4 10*3/uL (ref 4.0–10.5)
nRBC: 0 % (ref 0.0–0.2)

## 2019-08-09 LAB — GLUCOSE, CAPILLARY
Glucose-Capillary: 188 mg/dL — ABNORMAL HIGH (ref 70–99)
Glucose-Capillary: 216 mg/dL — ABNORMAL HIGH (ref 70–99)
Glucose-Capillary: 178 mg/dL — ABNORMAL HIGH (ref 70–99)

## 2019-08-09 MED ORDER — LISINOPRIL 5 MG PO TABS: 5.0000 mg | ORAL_TABLET | Freq: Every day | ORAL | 1 refills | Status: DC

## 2019-08-09 MED ORDER — ASPIRIN EC 81 MG PO TBEC: 81.0000 mg | DELAYED_RELEASE_TABLET | Freq: Every day | ORAL | Status: DC

## 2019-08-09 MED ORDER — ATORVASTATIN CALCIUM 80 MG PO TABS: 80.0000 mg | ORAL_TABLET | Freq: Every day | ORAL | 1 refills | Status: DC

## 2019-08-09 MED ORDER — METOPROLOL TARTRATE 25 MG PO TABS: 25.0000 mg | ORAL_TABLET | Freq: Two times a day (BID) | ORAL | 1 refills | Status: DC

## 2019-08-09 MED ORDER — TICAGRELOR 90 MG PO TABS: 90.0000 mg | ORAL_TABLET | Freq: Two times a day (BID) | ORAL | 3 refills | Status: DC

## 2019-08-09 MED ORDER — ASPIRIN 81 MG PO TBEC: 81.0000 mg | DELAYED_RELEASE_TABLET | Freq: Every day | ORAL | Status: AC

## 2019-08-09 MED ORDER — METFORMIN HCL 500 MG PO TABS: 500.0000 mg | ORAL_TABLET | Freq: Every day | ORAL | 3 refills | Status: DC

## 2019-08-09 MED ORDER — METFORMIN HCL 500 MG PO TABS: 500.0000 mg | ORAL_TABLET | Freq: Every day | ORAL | Status: DC

## 2019-08-09 NOTE — Care Management (Signed)
Patient given Brilinta card and advised of copay.

## 2019-08-09 NOTE — Discharge Instructions (Signed)
Radial Site Care ° °This sheet gives you information about how to care for yourself after your procedure. Your health care provider may also give you more specific instructions. If you have problems or questions, contact your health care provider. °What can I expect after the procedure? °After the procedure, it is common to have: °· Bruising and tenderness at the catheter insertion area. °Follow these instructions at home: °Medicines °· Take over-the-counter and prescription medicines only as told by your health care provider. °Insertion site care °· Follow instructions from your health care provider about how to take care of your insertion site. Make sure you: °? Wash your hands with soap and water before you change your bandage (dressing). If soap and water are not available, use hand sanitizer. °? Change your dressing as told by your health care provider. °? Leave stitches (sutures), skin glue, or adhesive strips in place. These skin closures may need to stay in place for 2 weeks or longer. If adhesive strip edges start to loosen and curl up, you may trim the loose edges. Do not remove adhesive strips completely unless your health care provider tells you to do that. °· Check your insertion site every day for signs of infection. Check for: °? Redness, swelling, or pain. °? Fluid or blood. °? Pus or a bad smell. °? Warmth. °· Do not take baths, swim, or use a hot tub until your health care provider approves. °· You may shower 24-48 hours after the procedure, or as directed by your health care provider. °? Remove the dressing and gently wash the site with plain soap and water. °? Pat the area dry with a clean towel. °? Do not rub the site. That could cause bleeding. °· Do not apply powder or lotion to the site. °Activity ° °· For 24 hours after the procedure, or as directed by your health care provider: °? Do not flex or bend the affected arm. °? Do not push or pull heavy objects with the affected arm. °? Do not  drive yourself home from the hospital or clinic. You may drive 24 hours after the procedure unless your health care provider tells you not to. °? Do not operate machinery or power tools. °· Do not lift anything that is heavier than 10 lb (4.5 kg), or the limit that you are told, until your health care provider says that it is safe. °· Ask your health care provider when it is okay to: °? Return to work or school. °? Resume usual physical activities or sports. °? Resume sexual activity. °General instructions °· If the catheter site starts to bleed, raise your arm and put firm pressure on the site. If the bleeding does not stop, get help right away. This is a medical emergency. °· If you went home on the same day as your procedure, a responsible adult should be with you for the first 24 hours after you arrive home. °· Keep all follow-up visits as told by your health care provider. This is important. °Contact a health care provider if: °· You have a fever. °· You have redness, swelling, or yellow drainage around your insertion site. °Get help right away if: °· You have unusual pain at the radial site. °· The catheter insertion area swells very fast. °· The insertion area is bleeding, and the bleeding does not stop when you hold steady pressure on the area. °· Your arm or hand becomes pale, cool, tingly, or numb. °These symptoms may represent a serious problem   that is an emergency. Do not wait to see if the symptoms will go away. Get medical help right away. Call your local emergency services (911 in the [place] Do not drive yourself to the hospital. Summary  After the procedure, it is common to have bruising and tenderness at the site.  Follow instructions from your health care provider about how to take care of your radial site wound. Check the wound every day for signs of infection.  Do not lift anything that is heavier than 10 lb (4.5 kg), or the limit that you are told, until your health care provider says  that it is safe. This information is not intended to replace advice given to you by your health care provider. Make sure you discuss any questions you have with your health care provider. Document Released: 10/21/2010 Document Revised: 10/24/2017 Document Reviewed: 10/24/2017 Elsevier Patient Education  2020 [employer]   Heart-Healthy Eating Plan Heart-healthy meal planning includes:  Eating less unhealthy fats.  Eating more healthy fats.  Making other changes in your diet. Talk with your doctor or a diet specialist (dietitian) to create an eating plan that is right for you. What is my plan? Your doctor may recommend an eating plan that includes:  Total fat: ______% or less of total calories a day.  Saturated fat: ______% or less of total calories a day.  Cholesterol: less than _________mg a day. What are tips for following this plan? Cooking Avoid frying your food. Try to bake, boil, grill, or broil it instead. You can also reduce fat by:  Removing the skin from poultry.  Removing all visible fats from meats.  Steaming vegetables in water or broth. Meal planning   At meals, divide your plate into four equal parts: ? Fill one-half of your plate with vegetables and green salads. ? Fill one-fourth of your plate with whole grains. ? Fill one-fourth of your plate with lean protein foods.  Eat 4-5 servings of vegetables per day. A serving of vegetables is: ? 1 cup of raw or cooked vegetables. ? 2 cups of raw leafy greens.  Eat 4-5 servings of fruit per day. A serving of fruit is: ? 1 medium whole fruit. ?  cup of dried fruit. ?  cup of fresh, frozen, or canned fruit. ?  cup of 100% fruit juice.  Eat more foods that have soluble fiber. These are apples, broccoli, carrots, beans, peas, and barley. Try to get 20-30 g of fiber per day.  Eat 4-5 servings of nuts, legumes, and seeds per week: ? 1 serving of dried beans or legumes equals  cup after being cooked. ? 1  serving of nuts is  cup. ? 1 serving of seeds equals 1 tablespoon. General information  Eat more home-cooked food. Eat less restaurant, buffet, and fast food.  Limit or avoid alcohol.  Limit foods that are high in starch and sugar.  Avoid fried foods.  Lose weight if you are overweight.  Keep track of how much salt (sodium) you eat. This is important if you have high blood pressure. Ask your doctor to tell you more about this.  Try to add vegetarian meals each week. Fats  Choose healthy fats. These include olive oil and canola oil, flaxseeds, walnuts, almonds, and seeds.  Eat more omega-3 fats. These include salmon, mackerel, sardines, tuna, flaxseed oil, and ground flaxseeds. Try to eat fish at least 2 times each week.  Check food labels. Avoid foods with trans fats or high amounts of  saturated fat.  Limit saturated fats. ? These are often found in animal products, such as meats, butter, and cream. ? These are also found in plant foods, such as palm oil, palm kernel oil, and coconut oil.  Avoid foods with partially hydrogenated oils in them. These have trans fats. Examples are stick margarine, some tub margarines, cookies, crackers, and other baked goods. What foods can I eat? Fruits All fresh, canned (in natural juice), or frozen fruits. Vegetables Fresh or frozen vegetables (raw, steamed, roasted, or grilled). Green salads. Grains Most grains. Choose whole wheat and whole grains most of the time. Rice and pasta, including brown rice and pastas made with whole wheat. Meats and other proteins Lean, well-trimmed beef, veal, pork, and lamb. Chicken and [place] without skin. All fish and shellfish. Wild duck, rabbit, pheasant, and venison. Egg whites or low-cholesterol egg substitutes. Dried beans, peas, lentils, and tofu. Seeds and most nuts. Dairy Low-fat or nonfat cheeses, including ricotta and mozzarella. Skim or 1% milk that is liquid, powdered, or evaporated. Buttermilk  that is made with low-fat milk. Nonfat or low-fat yogurt. Fats and oils Non-hydrogenated (trans-free) margarines. Vegetable oils, including soybean, sesame, sunflower, olive, peanut, safflower, corn, canola, and cottonseed. Salad dressings or mayonnaise made with a vegetable oil. Beverages Mineral water. Coffee and tea. Diet carbonated beverages. Sweets and desserts Sherbet, gelatin, and fruit ice. Small amounts of dark chocolate. Limit all sweets and desserts. Seasonings and condiments All seasonings and condiments. The items listed above may not be a complete list of foods and drinks you can eat. Contact a dietitian for more options. What foods should I avoid? Fruits Canned fruit in heavy syrup. Fruit in cream or butter sauce. Fried fruit. Limit coconut. Vegetables Vegetables cooked in cheese, cream, or butter sauce. Fried vegetables. Grains Breads that are made with saturated or trans fats, oils, or whole milk. Croissants. Sweet rolls. Donuts. High-fat crackers, such as cheese crackers. Meats and other proteins Fatty meats, such as hot dogs, ribs, sausage, bacon, rib-eye roast or steak. High-fat deli meats, such as salami and bologna. Caviar. Domestic duck and goose. Organ meats, such as liver. Dairy Cream, sour cream, cream cheese, and creamed cottage cheese. Whole-milk cheeses. Whole or 2% milk that is liquid, evaporated, or condensed. Whole buttermilk. Cream sauce or high-fat cheese sauce. Yogurt that is made from whole milk. Fats and oils Meat fat, or shortening. Cocoa butter, hydrogenated oils, palm oil, coconut oil, palm kernel oil. Solid fats and shortenings, including bacon fat, salt pork, lard, and butter. Nondairy cream substitutes. Salad dressings with cheese or sour cream. Beverages Regular sodas and juice drinks with added sugar. Sweets and desserts Frosting. Pudding. Cookies. Cakes. Pies. Milk chocolate or white chocolate. Buttered syrups. Full-fat ice cream or ice cream  drinks. The items listed above may not be a complete list of foods and drinks to avoid. Contact a dietitian for more information. Summary  Heart-healthy meal planning includes eating less unhealthy fats, eating more healthy fats, and making other changes in your diet.  Eat a balanced diet. This includes fruits and vegetables, low-fat or nonfat dairy, lean protein, nuts and legumes, whole grains, and heart-healthy oils and fats. This information is not intended to replace advice given to you by your health care provider. Make sure you discuss any questions you have with your health care provider. Document Released: 03/19/2012 Document Revised: 11/22/2017 Document Reviewed: 10/26/2017 Elsevier Patient Education  2020 [employer].  . Diabetes Mellitus and Nutrition, Adult When you have diabetes (diabetes  mellitus), it is very important to have healthy eating habits because your blood sugar (glucose) levels are greatly affected by what you eat and drink. Eating healthy foods in the appropriate amounts, at about the same times every day, can help you:  Control your blood glucose.  Lower your risk of heart disease.  Improve your blood pressure.  Reach or maintain a healthy weight. Every person with diabetes is different, and each person has different needs for a meal plan. Your health care provider may recommend that you work with a diet and nutrition specialist (dietitian) to make a meal plan that is best for you. Your meal plan may vary depending on factors such as:  The calories you need.  The medicines you take.  Your weight.  Your blood glucose, blood pressure, and cholesterol levels.  Your activity level.  Other health conditions you have, such as heart or kidney disease. How do carbohydrates affect me? Carbohydrates, also called carbs, affect your blood glucose level more than any other type of food. Eating carbs naturally raises the amount of glucose in your blood. Carb  counting is a method for keeping track of how many carbs you eat. Counting carbs is important to keep your blood glucose at a healthy level, especially if you use insulin or take certain oral diabetes medicines. It is important to know how many carbs you can safely have in each meal. This is different for every person. Your dietitian can help you calculate how many carbs you should have at each meal and for each snack. Foods that contain carbs include:  Bread, cereal, rice, pasta, and crackers.  Potatoes and corn.  Peas, beans, and lentils.  Milk and yogurt.  Fruit and juice.  Desserts, such as cakes, cookies, ice cream, and candy. How does alcohol affect me? Alcohol can cause a sudden decrease in blood glucose (hypoglycemia), especially if you use insulin or take certain oral diabetes medicines. Hypoglycemia can be a life-threatening condition. Symptoms of hypoglycemia (sleepiness, dizziness, and confusion) are similar to symptoms of having too much alcohol. If your health care provider says that alcohol is safe for you, follow these guidelines:  Limit alcohol intake to no more than 1 drink per day for nonpregnant women and 2 drinks per day for men. One drink equals 12 oz of beer, 5 oz of wine, or 1 oz of hard liquor.  Do not drink on an empty stomach.  Keep yourself hydrated with water, diet soda, or unsweetened iced tea.  Keep in mind that regular soda, juice, and other mixers may contain a lot of sugar and must be counted as carbs. What are tips for following this plan?  Reading food labels  Start by checking the serving size on the "Nutrition Facts" label of packaged foods and drinks. The amount of calories, carbs, fats, and other nutrients listed on the label is based on one serving of the item. Many items contain more than one serving per package.  Check the total grams (g) of carbs in one serving. You can calculate the number of servings of carbs in one serving by dividing  the total carbs by 15. For example, if a food has 30 g of total carbs, it would be equal to 2 servings of carbs.  Check the number of grams (g) of saturated and trans fats in one serving. Choose foods that have low or no amount of these fats.  Check the number of milligrams (mg) of salt (sodium) in one serving. Most  people should limit total sodium intake to less than 2,300 mg per day.  Always check the nutrition information of foods labeled as "low-fat" or "nonfat". These foods may be higher in added sugar or refined carbs and should be avoided.  Talk to your dietitian to identify your daily goals for nutrients listed on the label. Shopping  Avoid buying canned, premade, or processed foods. These foods tend to be high in fat, sodium, and added sugar.  Shop around the outside edge of the grocery store. This includes fresh fruits and vegetables, bulk grains, fresh meats, and fresh dairy. Cooking  Use low-heat cooking methods, such as baking, instead of high-heat cooking methods like deep frying.  Cook using healthy oils, such as olive, canola, or sunflower oil.  Avoid cooking with butter, cream, or high-fat meats. Meal planning  Eat meals and snacks regularly, preferably at the same times every day. Avoid going long periods of time without eating.  Eat foods high in fiber, such as fresh fruits, vegetables, beans, and whole grains. Talk to your dietitian about how many servings of carbs you can eat at each meal.  Eat 4-6 ounces (oz) of lean protein each day, such as lean meat, chicken, fish, eggs, or tofu. One oz of lean protein is equal to: ? 1 oz of meat, chicken, or fish. ? 1 egg. ?  cup of tofu.  Eat some foods each day that contain healthy fats, such as avocado, nuts, seeds, and fish. Lifestyle  Check your blood glucose regularly.  Exercise regularly as told by your health care provider. This may include: ? 150 minutes of moderate-intensity or vigorous-intensity exercise  each week. This could be brisk walking, biking, or water aerobics. ? Stretching and doing strength exercises, such as yoga or weightlifting, at least 2 times a week.  Take medicines as told by your health care provider.  Do not use any products that contain nicotine or tobacco, such as cigarettes and e-cigarettes. If you need help quitting, ask your health care provider.  Work with a Social worker or diabetes educator to identify strategies to manage stress and any emotional and social challenges. Questions to ask a health care provider  Do I need to meet with a diabetes educator?  Do I need to meet with a dietitian?  What number can I call if I have questions?  When are the best times to check my blood glucose? Where to find more information:  American Diabetes Association: diabetes.org  Academy of Nutrition and Dietetics: www.eatright.[employer] of Diabetes and Digestive and Kidney Diseases (NIH): [employer] Summary  A healthy meal plan will help you control your blood glucose and maintain a healthy lifestyle.  Working with a diet and nutrition specialist (dietitian) can help you make a meal plan that is best for you.  Keep in mind that carbohydrates (carbs) and alcohol have immediate effects on your blood glucose levels. It is important to count carbs and to use alcohol carefully. This information is not intended to replace advice given to you by your health care provider. Make sure you discuss any questions you have with your health care provider. Document Released: 06/15/2005 Document Revised: 08/31/2017 Document Reviewed: 10/23/2016 Elsevier Patient Education  2020 [employer].

## 2019-08-09 NOTE — Plan of Care (Signed)

## 2019-08-09 NOTE — Discharge Summary (Addendum)
Discharge Summary    Patient ID: Jason Wells,  MRN: [id number], DOB/AGE: 1961-04-04 58 y.o.  Admit date: 08/07/2019 Discharge date: 08/09/2019  Primary Care Provider: Daylene Posey Primary Cardiologist: Peter Martinique, MD   Discharge Diagnoses    Principal Problem:   STEMI involving oth coronary artery of anterior wall [facility]) Active Problems:   Tobacco abuse   Sleep apnea   Hypercholesterolemia   STEMI involving left anterior descending coronary artery ([place])  History of Present Illness     Jason Wells is a 58 y.o. male with past medical history of OSA, Bell's Palsy and tobacco use who presented to [place] on 08/07/2019 and was found to have an Anterior STEMI. CODE STEMI was activated and he was transported to [facility] for further management.  He reported developing chest pain after having mowed his yard earlier that day. Reported associated pain down his arms bilaterally with associated diaphoresis. Initial EKG showed NSR with ST elevation along V1-V4. COVID testing was negative. Initial troponin was 45 with repeat [place] of 75643 and repeat > 27000. Creatinine 1.06. Hgb A1c 8.7. Lipid Panel showed total cholesterol of 314, HDL 39, triglycerides 251 and LDL 225.  Hospital Course     Consultants: None  Cardiac catheterization was performed by Dr. Martinique and showed single vessel occlusive CAD with 100% stenosis of the mid-LAD and 65% 1st Diag stenosis. EF was preserved at 50-55%. He underwent successful thrombectomy and DESx1 to the mid-LAD. Was treated with Aggrastat for 18 hours then started on DAPT with ASA and Brilinta. Medical management of the D1 lesion was recommended unless he developed recurrent angina.   The following morning, he reported improvement in his symptoms. He was having episodes of NSVT on telemetry and was started on Lopressor 25mg  BID. Echocardiogram was obtained and showed an EF of 45-50% with mid and apical anterior septum and  apex WMA. No significant valve abnormalities were noted.   On 08/09/2019, he reported feeling back to baseline. Was examined by Dr. Harl Bowie and deemed stable for discharge. Was continued on medical therapy with ASA, Brilinta, Lisinopril 5mg  daily and Lopressor 25mg  BID. Given his Type 2 DM, Metformin 500mg  BID was added to his regimen to start on 11/8 once 48 hours out from his catheterization. The importance of establishing with a PCP was reviewed with the patient.   A staff message was sent to the office to arrange for a 7-10 day TOC visit.    _____________  Discharge Vitals Blood pressure 104/77, pulse 69, temperature 98.4 F (36.9 C), temperature source Oral, resp. rate 18, height 6' (1.829 m), weight 113.4 kg, SpO2 96 %.  Filed Weights   08/07/19 1631 08/08/19 0700 08/09/19 0415  Weight: 116.1 kg 115.2 kg 113.4 kg    Labs & Radiologic Studies     CBC Recent Labs    08/08/19 0304 08/09/19 0311  WBC 7.2 7.4  HGB 15.1 15.3  HCT 43.3 43.8  MCV 88.4 88.1  PLT 131* 329*   Basic Metabolic Panel Recent Labs    08/07/19 1938  08/08/19 0304 08/09/19 0311  NA  --   --  137 136  K  --   --  3.5 3.9  CL  --   --  107 105  CO2  --   --  20* 20*  GLUCOSE  --   --  126* 153*  BUN  --   --  13 14  CREATININE  --    < >  0.84 1.02  CALCIUM  --   --  8.6* 9.0  MG 2.0  --   --   --    < > = values in this interval not displayed.   Liver Function Tests Recent Labs    08/07/19 1938  AST 113*  ALT 58*  ALKPHOS 92  BILITOT 1.2  PROT 6.7  ALBUMIN 3.4*   No results for input(s): LIPASE, AMYLASE in the last 72 hours. Cardiac Enzymes No results for input(s): CKTOTAL, CKMB, CKMBINDEX, TROPONINI in the last 72 hours. BNP Invalid input(s): POCBNP D-Dimer No results for input(s): DDIMER in the last 72 hours. Hemoglobin A1C Recent Labs    08/08/19 0304  HGBA1C 8.5*   Fasting Lipid Panel Recent Labs    08/08/19 0304  CHOL 257*  HDL 35*  LDLCALC 177*  TRIG 227*  CHOLHDL  7.3   Thyroid Function Tests Recent Labs    08/08/19 0304  TSH 1.368    Dg Chest Port 1 View  Result Date: 08/07/2019 CLINICAL DATA:  CP that started 30 min PTA while mowing the lawn-denies fever/flu like sx. Rt elbow pain .cp EXAM: PORTABLE CHEST 1 VIEW COMPARISON:  None. FINDINGS: Heart size is normal. The lungs are free of focal consolidations and pleural effusions. No pulmonary edema. IMPRESSION: No active disease. Electronically Signed   By: Norva PavlovElizabeth  Brown M.D.   On: 08/07/2019 17:16     Diagnostic Studies/Procedures     Cardiac Catheterization: 08/07/2019  Mid LAD lesion is 100% stenosed.  1st Diag lesion is 65% stenosed.  The left ventricular systolic function is normal.  LV end diastolic pressure is normal.  The left ventricular ejection fraction is 50-55% by visual estimate.  Post intervention, there is a 0% residual stenosis.  A drug-eluting stent was successfully placed using a STENT RESOLUTE ONYX 5.0X30.   1. 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 This vessel is very large and ectatic. Lesion was thrombotic.  2. Good overall LV function with apical HK 3. Normal LVEDP 4. Successful PCI of the mid LAD with thrombectomy and DES x 1.   Plan: will continue Aggrastat for 18 hours. DAPT for one year. Aggressive risk factor modification. I would manage the disease in the first diagonal medically unless he has recurrent angina.    Echocardiogram: 08/08/2019 IMPRESSIONS   1. Left ventricular ejection fraction, by visual estimation, is 45 to 50%. The left ventricle has mildly decreased function. There is mildly increased left ventricular hypertrophy.  2. Mid and apical anterior septum and apex are abnormal.  3. Left ventricular diastolic parameters are indeterminate.  4. Global right ventricle has normal systolic function.The right ventricular size is normal. No increase in right ventricular wall thickness.  5. Left atrial size was normal.  6. Right  atrial size was normal.  7. The mitral valve is normal in structure. No evidence of mitral valve regurgitation.  8. The tricuspid valve is normal in structure. Tricuspid valve regurgitation is trivial.  9. The aortic valve is tricuspid. Aortic valve regurgitation is not visualized. 10. The pulmonic valve was not well visualized. Pulmonic valve regurgitation is not visualized. 11. Mildly elevated pulmonary artery systolic pressure. 12. The inferior vena cava is normal in size with <50% respiratory variability, suggesting right atrial pressure of 8 mmHg.  Disposition   Pt is being discharged home today in good condition.  Follow-up Plans & Appointments    Follow-up Information    [place]il[place], Peter M, MD Follow up.   Specialty: Cardiology  Why: The office should contact you within 2-3 business days to arrange follow-up. If you do not hear from them within this time frame, please call the number provided.  Contact information: [address] [place] [address] [phone]          Discharge Instructions    Amb Referral to Cardiac Rehabilitation   Complete by: As directed    To [place]   Diagnosis:  STEMI PTCA Coronary Stents     After initial evaluation and assessments completed: Virtual Based Care may be provided alone or in conjunction with Phase 2 Cardiac Rehab based on patient barriers.: Yes   Diet - low sodium heart healthy   Complete by: As directed    Discharge instructions   Complete by: As directed    PLEASE REMEMBER TO BRING ALL OF YOUR MEDICATIONS TO EACH OF YOUR FOLLOW-UP OFFICE VISITS.  PLEASE ATTEND ALL SCHEDULED FOLLOW-UP APPOINTMENTS.   Activity: Increase activity slowly as tolerated. You may shower, but no soaking baths (or swimming) for 1 week. You may not return to work until cleared by your cardiologist. No lifting over 10 lbs for 4 weeks. No sexual activity for 4 weeks.   Wound Care: You may wash cath site gently with soap and water. Keep  cath site clean and dry. If you notice pain, swelling, bleeding or pus at your cath site, please call [phone].   Increase activity slowly   Complete by: As directed       Discharge Medications     Medication List    TAKE these medications   aspirin 81 MG EC tablet Take 1 tablet (81 mg total) by mouth daily. Start taking on: August 10, 2019 What changed:   how much to take  additional instructions   atorvastatin 80 MG tablet Commonly known as: LIPITOR Take 1 tablet (80 mg total) by mouth daily at 6 PM. What changed:   medication strength  how much to take   cetirizine 10 MG tablet Commonly known as: ZYRTEC Take 10 mg by mouth daily.   lisinopril 5 MG tablet Commonly known as: ZESTRIL Take 1 tablet (5 mg total) by mouth daily. Start taking on: August 10, 2019   metFORMIN 500 MG tablet Commonly known as: GLUCOPHAGE Take 1 tablet (500 mg total) by mouth daily with breakfast. Start taking on: August 10, 2019   metoprolol tartrate 25 MG tablet Commonly known as: LOPRESSOR Take 1 tablet (25 mg total) by mouth 2 (two) times daily.   multivitamin with minerals Tabs tablet Take 1 tablet by mouth daily.   nicotine 21 mg/24hr patch Commonly known as: NICODERM CQ - dosed in mg/24 hours Place 1 patch onto the skin daily.   ticagrelor 90 MG Tabs tablet Commonly known as: BRILINTA Take 1 tablet (90 mg total) by mouth 2 (two) times daily.         Allergies Allergies  Allergen Reactions  . Latex Rash    States gloves causes rash from extended contact     Yes                               AHA/ACC Clinical Performance & Quality Measures: 1. Aspirin prescribed? - Yes 2. ADP Receptor Inhibitor (Plavix/Clopidogrel, Brilinta/Ticagrelor or Effient/Prasugrel) prescribed (includes medically managed patients)? - Yes 3. Beta Blocker prescribed? - Yes 4. High Intensity Statin (Lipitor 40-80mg  or Crestor 20-40mg ) prescribed? - Yes 5. EF assessed during THIS  hospitalization? - Yes 6. For EF <40%, was ACEI/ARB prescribed? - Not Applicable (EF >/= 40%) 7. For EF <40%, Aldosterone Antagonist (Spironolactone or Eplerenone) prescribed? - Not Applicable (EF >/= 40%) 8. Cardiac Rehab Phase II ordered (Included Medically managed Patients)? - Yes    Outstanding Labs/Studies   FLP and LFT's in 6-8 weeks.   Duration of Discharge Encounter   Greater than 30 minutes including physician time.  Signed, Ellsworth Lennox, PA-C 08/09/2019, 12:57 PM

## 2019-08-09 NOTE — Progress Notes (Signed)
Progress Note  Patient Name: Keygan Dumond Date of Encounter: 08/09/2019  Primary Cardiologist: Peter [place], MD  Subjective   No complaints  Inpatient Medications    Scheduled Meds: . aspirin  81 mg Oral Daily  . atorvastatin  80 mg Oral q1800  . Chlorhexidine Gluconate Cloth  6 each Topical Daily  . enoxaparin (LOVENOX) injection  40 mg Subcutaneous Q24H  . insulin aspart  0-5 Units Subcutaneous QHS  . insulin aspart  0-9 Units Subcutaneous TID WC  . lisinopril  5 mg Oral Daily  . metoprolol tartrate  25 mg Oral BID  . nicotine  14 mg Transdermal Daily  . sodium chloride flush  3 mL Intravenous Once  . sodium chloride flush  3 mL Intravenous Q12H  . ticagrelor  90 mg Oral BID   Continuous Infusions: . sodium chloride    . sodium chloride     PRN Meds: sodium chloride, acetaminophen, ondansetron (ZOFRAN) IV, sodium chloride flush   Vital Signs    Vitals:   08/08/19 1524 08/08/19 1620 08/08/19 2047 08/09/19 0415  BP:  132/78 111/68 104/77  Pulse:  70 65 69  Resp:  18    Temp: (!) 97.2 F (36.2 C) 98.4 F (36.9 C) 98.4 F (36.9 C) 98.4 F (36.9 C)  TempSrc: Oral Oral Oral Oral  SpO2:  98% 99% 96%  Weight:    113.4 kg  Height:        Intake/Output Summary (Last 24 hours) at 08/09/2019 1059 Last data filed at 08/08/2019 1500 Gross per 24 hour  Intake 132.77 ml  Output 500 ml  Net -367.23 ml   Last 3 Weights 08/09/2019 08/08/2019 08/07/2019  Weight (lbs) 249 lb 14.4 oz 253 lb 15.5 oz 256 lb  Weight (kg) 113.354 kg 115.2 kg 116.121 kg      Telemetry    SR, short rare runs of NSVT up to 4 beats- Personally Reviewed  ECG    n/a - Personally Reviewed  Physical Exam   GEN: No acute distress.   Neck: No JVD Cardiac: RRR, no murmurs, rubs, or gallops.  Respiratory: Clear to auscultation bilaterally. GI: Soft, nontender, non-distended  MS: No edema; No deformity. Neuro:  Nonfocal  Psych: Normal affect   Labs    High Sensitivity Troponin:    Recent Labs  Lab 08/07/19 1640 08/07/19 1938 08/07/19 2218 08/08/19 0304  TROPONINIHS 45* 25,508* >27,000* >27,000*      Chemistry Recent Labs  Lab 08/07/19 1650 08/07/19 1938 08/07/19 2218 08/08/19 0304 08/09/19 0311  NA 137  --   --  137 136  K 3.4*  --   --  3.5 3.9  CL 102  --   --  107 105  CO2 22  --   --  20* 20*  GLUCOSE 189*  --   --  126* 153*  BUN 18  --   --  13 14  CREATININE 1.06  --  0.93 0.84 1.02  CALCIUM 9.1  --   --  8.6* 9.0  PROT  --  6.7  --   --   --   ALBUMIN  --  3.4*  --   --   --   AST  --  113*  --   --   --   ALT  --  58*  --   --   --   ALKPHOS  --  92  --   --   --   BILITOT  --  1.2  --   --   --   GFRNONAA >60  --  >60 >60 >60  GFRAA >60  --  >60 >60 >60  ANIONGAP 13  --   --  10 11     Hematology Recent Labs  Lab 08/07/19 2218 08/08/19 0304 08/09/19 0311  WBC 7.4 7.2 7.4  RBC 4.97 4.90 4.97  HGB 15.3 15.1 15.3  HCT 43.1 43.3 43.8  MCV 86.7 88.4 88.1  MCH 30.8 30.8 30.8  MCHC 35.5 34.9 34.9  RDW 12.7 12.8 12.9  PLT 125* 131* 125*    BNPNo results for input(s): BNP, PROBNP in the last 168 hours.   DDimer No results for input(s): DDIMER in the last 168 hours.   Radiology    Dg Chest Port 1 View  Result Date: 08/07/2019 CLINICAL DATA:  CP that started 30 min PTA while mowing the lawn-denies fever/flu like sx. Rt elbow pain .cp EXAM: PORTABLE CHEST 1 VIEW COMPARISON:  None. FINDINGS: Heart size is normal. The lungs are free of focal consolidations and pleural effusions. No pulmonary edema. IMPRESSION: No active disease. Electronically Signed   By: Nolon Nations M.D.   On: 08/07/2019 17:16    Cardiac Studies    Patient Profile     Isao Seltzer a 58 y.o.malewith no prior cardiac hx, TIIDM, sleep apnea, + tobacco hx w/30 pack years and bell's palsy now presents with acute chest pain to med [facility]. Acute EKG changes and transferred to [facility] for STEMI  Assessment & Plan    1. STEMI - Presented with  anterior stemi, emergent cath showed 100% LAD stenosis with placement of stent.  - echo LVEF 45-50%, apical hypokinesis - medical therapy with ASA 81, atorva 80, lisinopril 5mg , lopressor 25mg  bid, brillinta 90mg  bid   2. New diagnosis of DM2 - will need pcp f/u to initiate treatment. Start metformin 500mg  daily on discharge as initial management   Patient is to establish with pcp   Ok for discharge today.   For questions or updates, please contact [place] Please consult [email or web address] for contact info under        Signed, Carlyle Dolly, MD  08/09/2019, 10:59 AM

## 2019-08-11 ENCOUNTER — Telehealth: Payer: Self-pay | Admitting: Physician Assistant

## 2019-08-11 NOTE — Telephone Encounter (Signed)
Attempted to contact patient to do TOC call- number on file unavaliable. Will try again later.

## 2019-08-11 NOTE — Telephone Encounter (Signed)
TOC Patient- Please call Patient- Pt has an appt with Almyra Deforest on 08-18-19

## 2019-08-12 NOTE — Telephone Encounter (Signed)
LM2CB 

## 2019-08-13 NOTE — Telephone Encounter (Signed)
Patient contacted regarding discharge from 08/07/2019 - 08/09/2019 (2 days)  [facility]   Patient understands to follow up with provider HAO on 11-16 at [place] at [facility]. Patient understands discharge instructions? YES Patient understands medications and regiment? YES Patient understands to bring all medications to this visit? YES  PT WILL ARRIVE EARLY, WITH MASK AND NO VISITORS

## 2019-08-13 NOTE — Telephone Encounter (Signed)
Patient returning call.

## 2019-08-14 ENCOUNTER — Telehealth: Payer: Self-pay

## 2019-08-14 NOTE — Telephone Encounter (Signed)
Follow up     Pt is returning call  He has questions about going back to work and his appt being virtual     Please call  Back

## 2019-08-14 NOTE — Telephone Encounter (Signed)
Left a detailed message for the patient about his upcoming virtual  appointment with Almyra Deforest, PA-C on Monday 08/18/19 at [place] and switching the appointment to Wednesday 08/20/19.

## 2019-08-14 NOTE — Telephone Encounter (Signed)
Patient returning your call.

## 2019-08-17 NOTE — Progress Notes (Signed)
Cardiology Office Note   Date:  08/18/2019   ID:  Jason Wells, DOB 08-03-1961, MRN [id number]  PCP:  Patient, No Pcp Per  Cardiologist:  Dr. Martinique  No chief complaint on file.    History of Present Illness: Jason Wells is a 58 y.o. male who presents for posthospitalization follow-up after admission for acute anterior STEMI.  He has a past medical history of OSA, Bell's palsy, and tobacco abuse.  He presented to med center at [facility] on 08/07/2019 after complaining of chest pain with pain radiating down both arms with associated diaphoresis with EKG changes anteriorly representing STEMI.  He was emergently transferred to [facility].  Emergent cardiac catheterization performed by Dr. Martinique, revealed single-vessel occlusive CAD with 100% stenosis of the mid LAD and 65% first diagonal stenosis.  He had preserved EF of 50% to 55%.  He underwent successful thrombectomy with drug-eluting stent x1 to the mid LAD.  He was started on dual antiplatelet therapy with aspirin and Brilinta.  Medical management of the diagonal 1 lesion was recommended unless he developed recurrent angina.  During hospitalization he was also having episodes of NSVT on telemetry and was started on Lopressor 25 mg twice daily.  Echocardiogram revealed EF of 45% to 50% with mid and apical anterior septum and apex wall motion abnormalities with no significant valve abnormalities.  He was discharged on aspirin, Brilinta, lisinopril 5 mg, Lopressor 25 mg twice daily.  Given his history of type 2 diabetes he was also started on Metformin 500 mg daily and was to follow-up with his primary care physician for further recommendations.  Jason Wells is here today without any significant complaints.  He still has some mild shortness of breath associated with taking Brilinta.  He states that his energy level has changed somewhat he does not feel as strong as he normally feels, however he has not been doing a lot since leaving  the hospital.  He has multiple questions concerning his medications prognosis and activities.  Past Medical History:  Diagnosis Date  . Bell's palsy   . CAD (coronary artery disease)    a. s/p STEMI in 08/2019 with DES to mid-LAD. Residual 65% D1 stenosis.   . Facial palsy   . Headache(784.0)   . Sleep apnea 8 years ago    Past Surgical History:  Procedure Laterality Date  . CORONARY/GRAFT ACUTE MI REVASCULARIZATION N/A 08/07/2019   Procedure: CORONARY/GRAFT ACUTE MI REVASCULARIZATION;  Surgeon: Martinique, Peter M, MD;  Location: [place] [facility];  Service: Cardiovascular;  Laterality: N/A;  . LEFT HEART CATH AND CORONARY ANGIOGRAPHY N/A 08/07/2019   Procedure: LEFT HEART CATH AND CORONARY ANGIOGRAPHY;  Surgeon: Martinique, Peter M, MD;  Location: [place] [facility];  Service: Cardiovascular;  Laterality: N/A;     Current Outpatient Medications  Medication Sig Dispense Refill  . aspirin EC 81 MG EC tablet Take 1 tablet (81 mg total) by mouth daily.    Marland Kitchen atorvastatin (LIPITOR) 80 MG tablet Take 1 tablet (80 mg total) by mouth daily at 6 PM. 90 tablet 3  . cetirizine (ZYRTEC) 10 MG tablet Take 10 mg by mouth daily.    Marland Kitchen lisinopril (ZESTRIL) 5 MG tablet Take 1 tablet (5 mg total) by mouth daily. 90 tablet 3  . metFORMIN (GLUCOPHAGE) 500 MG tablet Take 1 tablet (500 mg total) by mouth daily with breakfast. 60 tablet 3  . metoprolol tartrate (LOPRESSOR) 25 MG tablet Take 1 tablet (25 mg total) by mouth 2 (two)  times daily. 180 tablet 3  . Multiple Vitamin (MULTIVITAMIN WITH MINERALS) TABS tablet Take 1 tablet by mouth daily.    . ticagrelor (BRILINTA) 90 MG TABS tablet Take 1 tablet (90 mg total) by mouth 2 (two) times daily. 180 tablet 3   No current facility-administered medications for this visit.     Allergies:   Latex    Social History:  The patient  reports that he has been smoking cigarettes. He has a 30.00 pack-year smoking history. He has never used smokeless tobacco. He reports  current alcohol use. He reports that he does not use drugs.   Family History:  The patient's family history includes Cancer in his mother.    ROS: All other systems are reviewed and negative. Unless otherwise mentioned in H&P    PHYSICAL EXAM: VS:  BP 110/80   Pulse 62   Temp (!) 97.2 F (36.2 C)   Ht 6' (1.829 m)   Wt 257 lb (116.6 kg)   SpO2 98%   BMI 34.86 kg/m  , BMI Body mass index is 34.86 kg/m. GEN: Well nourished, well developed, in no acute distress HEENT: normal Neck: no JVD, carotid bruits, or masses Cardiac: RRR; no murmurs, rubs, or gallops,no edema  Respiratory:  Clear to auscultation bilaterally, normal work of breathing GI: soft, nontender, nondistended, + BS MS: no deformity or atrophy Skin: warm and dry, no rash Neuro:  Strength and sensation are intact Psych: euthymic mood, full affect   EKG: Normal sinus rhythm, T wave abnormality anteriorly heart rate of 62 bpm.   Recent Labs: 08/07/2019: ALT 58; Magnesium 2.0 08/08/2019: TSH 1.368 08/09/2019: BUN 14; Creatinine, Ser 1.02; Hemoglobin 15.3; Platelets 125; Potassium 3.9; Sodium 136    Lipid Panel    Component Value Date/Time   CHOL 257 (H) 08/08/2019 0304   TRIG 227 (H) 08/08/2019 0304   HDL 35 (L) 08/08/2019 0304   CHOLHDL 7.3 08/08/2019 0304   VLDL 45 (H) 08/08/2019 0304   LDLCALC 177 (H) 08/08/2019 0304      Wt Readings from Last 3 Encounters:  08/18/19 257 lb (116.6 kg)  08/09/19 249 lb 14.4 oz (113.4 kg)  12/16/12 254 lb 6.4 oz (115.4 kg)      Other studies Reviewed: LHC 08/07/2019  Mid LAD lesion is 100% stenosed.  1st Diag lesion is 65% stenosed.  The left ventricular systolic function is normal.  LV end diastolic pressure is normal.  The left ventricular ejection fraction is 50-55% by visual estimate.  Post intervention, there is a 0% residual stenosis.  A drug-eluting stent was successfully placed using a STENT RESOLUTE ONYX 5.0X30.   1. Single vessel occlusive CAD  involving the mid LAD. This vessel is very large and ectatic. Lesion was thrombotic.  2. Good overall LV function with apical HK 3. Normal LVEDP 4. Successful PCI of the mid LAD with thrombectomy and DES x 1.   Plan: will continue Aggrastat for 18 hours. DAPT for one year. Aggressive risk factor modification. I would manage the disease in the first diagonal medically unless he has recurrent angina.   Echocardiogram 08/08/2019 1. Left ventricular ejection fraction, by visual estimation, is 45 to 50%. The left ventricle has mildly decreased function. There is mildly increased left ventricular hypertrophy.  2. Mid and apical anterior septum and apex are abnormal.  3. Left ventricular diastolic parameters are indeterminate.  4. Global right ventricle has normal systolic function.The right ventricular size is normal. No increase in right ventricular wall thickness.  5. Left atrial size was normal.  6. Right atrial size was normal.  7. The mitral valve is normal in structure. No evidence of mitral valve regurgitation.  8. The tricuspid valve is normal in structure. Tricuspid valve regurgitation is trivial.  9. The aortic valve is tricuspid. Aortic valve regurgitation is not visualized. 10. The pulmonic valve was not well visualized. Pulmonic valve regurgitation is not visualized. 11. Mildly elevated pulmonary artery systolic pressure. 12. The inferior vena cava is normal in size with <50% respiratory variability, suggesting right atrial pressure of 8 mmHg.  ASSESSMENT AND PLAN:  1. CAD: Status post anterior STEMI requiring drug-eluting stent to the first diagonal, and 100% stenosis of the mid LAD, also requiring DES.  EF on echo revealed a reduced EF of 45 to 50% with mid and apical anterior septal, and apex wall motion abnormalities.  He is currently on dual antiplatelet therapy with ticagrelor 90 mg twice daily, and aspirin 81 mg daily.  He denies any bleeding, excessive bruising, or hemoptysis.   He does have some shortness of breath associated with Brilinta within about 45 minutes of taking it.  I have advised him to drink a small amount of caffeine if this should occur to help alleviate symptoms.  The patient is to inform us if this does not get better after a month.  At which time we may consider switching him to Plavix.  For now we will keep him on his current medication regimen.  I have gone over all of his medications, given explanations for each of them to include their purpose and efficacy.  He is given some samples of ticagrelor.  I have also gone over his cardiac catheterization illustration and discussed where his stents are located and the intervention that occurred.  He also asked about alcohol intake.  I have advised him that he needs to avoid heavy alcohol intake, a glass of wine each night or occasional beer is okay.  I have advised him heavy use of alcohol is not recommended.  He verbalized understanding.  2.  Hypertension: Blood pressure is low normal today on lisinopril, and metoprolol.  We will continue to monitor his response to medications.  With reduced EF, would like to keep his blood pressure within this current range.  He will have follow-up BMET in 3 months.  3.  Non-insulin-dependent diabetes: He will continue on Metformin 500 mg twice daily.  He has not yet found a primary care physician who will need to follow his diabetic medications and hemoglobin A1c.  He is offered a phone number for [facility] which will assist him in finding a PCP in the [facility] area.  4.  Hyperlipidemia: Goal of LDL less than 70.  I have explained this to him and shown him what his current level is of 178.  He will continue on atorvastatin 80 mg daily with follow-up lipids and LFTs in 3 months.   Current medicines are reviewed at length with the patient today.    Labs/ tests ordered today include: Fasting lipids LFTs, BMET, CBC (3 months). Bettey MareKathryn M. Liborio NixonLawrence DNP, ANP, AACC    08/18/2019 4:11 PM    [facility] [address] Office [phone] Fax [phone]  Notice: This dictation was prepared with Dragon dictation along with smaller phrase technology. Any transcriptional errors that result from this process are unintentional and may not be corrected upon review.

## 2019-08-18 ENCOUNTER — Encounter: Payer: Self-pay | Admitting: Adult Health

## 2019-08-18 ENCOUNTER — Ambulatory Visit: Payer: PRIVATE HEALTH INSURANCE | Admitting: Adult Health

## 2019-08-18 ENCOUNTER — Other Ambulatory Visit: Payer: Self-pay

## 2019-08-18 ENCOUNTER — Telehealth: Payer: PRIVATE HEALTH INSURANCE | Admitting: Physician Assistant

## 2019-08-18 VITALS — BP 110/80 | HR 62 | Temp 97.2°F | Ht 72.0 in | Wt 257.0 lb

## 2019-08-18 DIAGNOSIS — E7800 Pure hypercholesterolemia, unspecified: Secondary | ICD-10-CM

## 2019-08-18 DIAGNOSIS — Z79899 Other long term (current) drug therapy: Secondary | ICD-10-CM | POA: Diagnosis not present

## 2019-08-18 DIAGNOSIS — I2109 ST elevation (STEMI) myocardial infarction involving other coronary artery of anterior wall: Secondary | ICD-10-CM

## 2019-08-18 MED ORDER — TICAGRELOR 90 MG PO TABS: 90.0000 mg | ORAL_TABLET | Freq: Two times a day (BID) | ORAL | 3 refills | Status: DC

## 2019-08-18 MED ORDER — ATORVASTATIN CALCIUM 80 MG PO TABS: 80.0000 mg | ORAL_TABLET | Freq: Every day | ORAL | 3 refills | Status: DC

## 2019-08-18 MED ORDER — METOPROLOL TARTRATE 25 MG PO TABS: 25.0000 mg | ORAL_TABLET | Freq: Two times a day (BID) | ORAL | 3 refills | Status: DC

## 2019-08-18 MED ORDER — LISINOPRIL 5 MG PO TABS: 5.0000 mg | ORAL_TABLET | Freq: Every day | ORAL | 3 refills | Status: DC

## 2019-08-18 NOTE — Patient Instructions (Signed)
Medication Instructions:  Continue current medications  *If you need a refill on your cardiac medications before your next appointment, please call your pharmacy*  Lab Work: Fasting Lipids, BMP, CBC in 3 months  If you have labs (blood work) drawn today and your tests are completely normal, you will receive your results only by: Marland Kitchen MyChart Message (if you have MyChart) OR . A paper copy in the mail If you have any lab test that is abnormal or we need to change your treatment, we will call you to review the results.  Testing/Procedures: None Ordered  Follow-Up: At [facility], you and your health needs are our priority.  As part of our continuing mission to provide you with exceptional heart care, we have created designated Provider Care Teams.  These Care Teams include your primary Cardiologist (physician) and Advanced Practice Providers (APPs -  Physician Assistants and Nurse Practitioners) who all work together to provide you with the care you need, when you need it.  Your next appointment:   3 months  The format for your next appointment:   In Person  Provider:   Peter Martinique, MD  Other [place] [phone]

## 2019-11-23 NOTE — Progress Notes (Deleted)
Cardiology Office Note   Date:  11/23/2019   ID:  Jason Wells, DOB 01-13-1961, MRN [id number]  PCP:  Patient, No Pcp Per  Cardiologist:  Dr. Martinique  No chief complaint on file.    History of Present Illness: Jason Wells is a 60 y.o. male who presents for follow up CAD.  He has a past medical history of OSA, Bell's palsy, and tobacco abuse.  He presented to med center at [facility] on 08/07/2019 after complaining of chest pain with pain radiating down both arms with associated diaphoresis with EKG changes anteriorly representing STEMI.  He was emergently transferred to [facility].  Emergent cardiac catheterization  revealed single-vessel occlusive CAD with 100% stenosis of the mid LAD and 65% first diagonal stenosis.  He had preserved EF of 50% to 55%.  He underwent successful thrombectomy with drug-eluting stent x1 to the mid LAD.  He was started on dual antiplatelet therapy with aspirin and Brilinta.  Medical management of the diagonal 1 lesion was recommended unless he developed recurrent angina.  During hospitalization he was also having episodes of NSVT on telemetry and was started on Lopressor 25 mg twice daily.  Echocardiogram revealed EF of 45% to 50% with mid and apical anterior septum and apex wall motion abnormalities with no significant valve abnormalities.  He was discharged on aspirin, Brilinta, lisinopril 5 mg, Lopressor 25 mg twice daily.  Given his history of type 2 diabetes he was also started on Metformin 500 mg daily and was to follow-up with his primary care physician for further recommendations.  Jason Wells is here today without any significant complaints.  He still has some mild shortness of breath associated with taking Brilinta.  He states that his energy level has changed somewhat he does not feel as strong as he normally feels, however he has not been doing a lot since leaving the hospital.  He has multiple questions concerning his medications prognosis and  activities.  Past Medical History:  Diagnosis Date  . Bell's palsy   . CAD (coronary artery disease)    a. s/p STEMI in 08/2019 with DES to mid-LAD. Residual 65% D1 stenosis.   . Facial palsy   . Headache(784.0)   . Sleep apnea 8 years ago    Past Surgical History:  Procedure Laterality Date  . CORONARY/GRAFT ACUTE MI REVASCULARIZATION N/A 08/07/2019   Procedure: CORONARY/GRAFT ACUTE MI REVASCULARIZATION;  Surgeon: Martinique, Usiel Astarita M, MD;  Location: [place] [facility];  Service: Cardiovascular;  Laterality: N/A;  . LEFT HEART CATH AND CORONARY ANGIOGRAPHY N/A 08/07/2019   Procedure: LEFT HEART CATH AND CORONARY ANGIOGRAPHY;  Surgeon: Martinique, Jkai Arwood M, MD;  Location: [place] [facility];  Service: Cardiovascular;  Laterality: N/A;     Current Outpatient Medications  Medication Sig Dispense Refill  . aspirin EC 81 MG EC tablet Take 1 tablet (81 mg total) by mouth daily.    Marland Kitchen atorvastatin (LIPITOR) 80 MG tablet Take 1 tablet (80 mg total) by mouth daily at 6 PM. 90 tablet 3  . cetirizine (ZYRTEC) 10 MG tablet Take 10 mg by mouth daily.    Marland Kitchen lisinopril (ZESTRIL) 5 MG tablet Take 1 tablet (5 mg total) by mouth daily. 90 tablet 3  . metFORMIN (GLUCOPHAGE) 500 MG tablet Take 1 tablet (500 mg total) by mouth daily with breakfast. 60 tablet 3  . metoprolol tartrate (LOPRESSOR) 25 MG tablet Take 1 tablet (25 mg total) by mouth 2 (two) times daily. 180 tablet 3  . Multiple  Vitamin (MULTIVITAMIN WITH MINERALS) TABS tablet Take 1 tablet by mouth daily.    . ticagrelor (BRILINTA) 90 MG TABS tablet Take 1 tablet (90 mg total) by mouth 2 (two) times daily. 180 tablet 3   No current facility-administered medications for this visit.    Allergies:   Latex    Social History:  The patient  reports that he has been smoking cigarettes. He has a 30.00 pack-year smoking history. He has never used smokeless tobacco. He reports current alcohol use. He reports that he does not use drugs.   Family History:  The  patient's family history includes Cancer in his mother.    ROS: All other systems are reviewed and negative. Unless otherwise mentioned in H&P    PHYSICAL EXAM: VS:  There were no vitals taken for this visit. , BMI There is no height or weight on file to calculate BMI. GEN: Well nourished, well developed, in no acute distress HEENT: normal Neck: no JVD, carotid bruits, or masses Cardiac: RRR; no murmurs, rubs, or gallops,no edema  Respiratory:  Clear to auscultation bilaterally, normal work of breathing GI: soft, nontender, nondistended, + BS MS: no deformity or atrophy Skin: warm and dry, no rash Neuro:  Strength and sensation are intact Psych: euthymic mood, full affect   EKG: Normal sinus rhythm, T wave abnormality anteriorly heart rate of 62 bpm.   Recent Labs: 08/07/2019: ALT 58; Magnesium 2.0 08/08/2019: TSH 1.368 08/09/2019: BUN 14; Creatinine, Ser 1.02; Hemoglobin 15.3; Platelets 125; Potassium 3.9; Sodium 136    Lipid Panel    Component Value Date/Time   CHOL 257 (H) 08/08/2019 0304   TRIG 227 (H) 08/08/2019 0304   HDL 35 (L) 08/08/2019 0304   CHOLHDL 7.3 08/08/2019 0304   VLDL 45 (H) 08/08/2019 0304   LDLCALC 177 (H) 08/08/2019 0304      Wt Readings from Last 3 Encounters:  08/18/19 257 lb (116.6 kg)  08/09/19 249 lb 14.4 oz (113.4 kg)  12/16/12 254 lb 6.4 oz (115.4 kg)      Other studies Reviewed: LHC 08/07/2019  Mid LAD lesion is 100% stenosed.  1st Diag lesion is 65% stenosed.  The left ventricular systolic function is normal.  LV end diastolic pressure is normal.  The left ventricular ejection fraction is 50-55% by visual estimate.  Post intervention, there is a 0% residual stenosis.  A drug-eluting stent was successfully placed using a STENT RESOLUTE ONYX 5.0X30.   1. 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 This vessel is very large and ectatic. Lesion was thrombotic.  2. Good overall LV function with apical HK 3. Normal  LVEDP 4. Successful PCI of the mid LAD with thrombectomy and DES x 1.   Plan: will continue Aggrastat for 18 hours. DAPT for one year. Aggressive risk factor modification. I would manage the disease in the first diagonal medically unless he has recurrent angina.   Echocardiogram 08/08/2019 1. Left ventricular ejection fraction, by visual estimation, is 45 to 50%. The left ventricle has mildly decreased function. There is mildly increased left ventricular hypertrophy.  2. Mid and apical anterior septum and apex are abnormal.  3. Left ventricular diastolic parameters are indeterminate.  4. Global right ventricle has normal systolic function.The right ventricular size is normal. No increase in right ventricular wall thickness.  5. Left atrial size was normal.  6. Right atrial size was normal.  7. The mitral valve is normal in structure. No evidence of mitral valve regurgitation.  8. The tricuspid  valve is normal in structure. Tricuspid valve regurgitation is trivial.  9. The aortic valve is tricuspid. Aortic valve regurgitation is not visualized. 10. The pulmonic valve was not well visualized. Pulmonic valve regurgitation is not visualized. 11. Mildly elevated pulmonary artery systolic pressure. 12. The inferior vena cava is normal in size with <50% respiratory variability, suggesting right atrial pressure of 8 mmHg.  ASSESSMENT AND PLAN:  1. CAD: Status post anterior STEMI November 2020 requiring drug-eluting stent to the first diagonal, and 100% stenosis of the mid LAD, also requiring DES.  EF on echo revealed a reduced EF of 45 to 50% with mid and apical anterior septal, and apex wall motion abnormalities.  He is currently on dual antiplatelet therapy with ticagrelor 90 mg twice daily, and aspirin 81 mg daily.  He denies any bleeding, excessive bruising, or hemoptysis.  He does have some shortness of breath associated with Brilinta within about 45 minutes of taking it.  I have advised him to  drink a small amount of caffeine if this should occur to help alleviate symptoms.  The patient is to inform us if this does not get better after a month.  At which time we may consider switching him to Plavix.  For now we will keep him on his current medication regimen.  2.  Hypertension: Blood pressure is low normal today on lisinopril, and metoprolol.  We will continue to monitor his response to medications.  With reduced EF, would like to keep his blood pressure within this current range.  He will have follow-up BMET in 3 months.  3.  Non-insulin-dependent diabetes: He will continue on Metformin 500 mg twice daily.  He has not yet found a primary care physician who will need to follow his diabetic medications and hemoglobin A1c.  He is offered a phone number for [facility] medical group which will assist him in finding a PCP in the [facility] area.  4.  Hyperlipidemia: Goal of LDL less than 70.  I have explained this to him and shown him what his current level is of 178.  He will continue on atorvastatin 80 mg daily with follow-up lipids and LFTs in 3 months.   Current medicines are reviewed at length with the patient today.    Labs/ tests ordered today include: Fasting lipids LFTs, BMET, CBC (3 months).   Aleighna Wojtas [place] MD, [facility]     11/23/2019 12:11 PM    [facility] [address] Office [phone] Fax [phone]

## 2019-11-27 ENCOUNTER — Ambulatory Visit: Payer: PRIVATE HEALTH INSURANCE | Admitting: Cardiology

## 2020-01-13 NOTE — Progress Notes (Signed)
Cardiology Office Note   Date:  01/15/2020   ID:  Jason Wells, DOB [birth year], MRN [id number]  PCP:  Lucilla Edin  Cardiologist:  Dr. [place]  No chief complaint on file.    History of Present Illness: Jason Wells is a 59 y.o. male who presents for follow up CAD.  He has a past medical history of OSA, Bell's palsy, and tobacco abuse.  He presented to med center at [facility] on 08/07/2019 after complaining of chest pain with pain radiating down both arms with associated diaphoresis with EKG changes anteriorly representing STEMI.  He was emergently transferred to [facility].  Emergent cardiac catheterization  revealed single-vessel occlusive CAD with 100% stenosis of the mid LAD and 65% first diagonal stenosis.  He had preserved EF of 50% to 55%.  He underwent successful thrombectomy with drug-eluting stent x1 to the mid LAD.  He was started on dual antiplatelet therapy with aspirin and Brilinta.   During hospitalization he was also having episodes of NSVT on telemetry and was started on Lopressor 25 mg twice daily.  Echocardiogram revealed EF of 45% to 50% with mid and apical anterior septum and apex wall motion abnormalities with no significant valve abnormalities.  He was discharged on aspirin, Brilinta, lisinopril 5 mg, Lopressor 25 mg twice daily.  Given his history of type 2 diabetes he was also started on Metformin.  On follow up today he is doing well. Denies any chest pain or SOB. Walks 5-7 miles per day at work and rides a stationary bike at home. He has lost 8 lbs but sugars are poorly controlled. A1c 11% this month and metformin increased to 2000 mg daily. No bleeding. No palpitations.  Tolerating medication well.   Past Medical History:  Diagnosis Date  . Bell's palsy   . CAD (coronary artery disease)    a. s/p STEMI in 08/2019 with DES to mid-LAD. Residual 65% D1 stenosis.   . Facial palsy   . Headache(784.0)   . Sleep apnea 8 years ago    Past  Surgical History:  Procedure Laterality Date  . CORONARY/GRAFT ACUTE MI REVASCULARIZATION N/A 08/07/2019   Procedure: CORONARY/GRAFT ACUTE MI REVASCULARIZATION;  Surgeon: [place]il[place], Ronne Stefanski M, MD;  Location: [facility] INVASIVE CV LAB;  Service: Cardiovascular;  Laterality: N/A;  . LEFT HEART CATH AND CORONARY ANGIOGRAPHY N/A 08/07/2019   Procedure: LEFT HEART CATH AND CORONARY ANGIOGRAPHY;  Surgeon: [place]il[place], Sierra Bissonette M, MD;  Location: [facility] INVASIVE CV LAB;  Service: Cardiovascular;  Laterality: N/A;     Current Outpatient Medications  Medication Sig Dispense Refill  . aspirin EC 81 MG EC tablet Take 1 tablet (81 mg total) by mouth daily.    Marland Kitchen atorvastatin (LIPITOR) 80 MG tablet Take 1 tablet (80 mg total) by mouth daily at 6 PM. 90 tablet 3  . cetirizine (ZYRTEC) 10 MG tablet Take 10 mg by mouth daily.    Marland Kitchen lisinopril (ZESTRIL) 5 MG tablet Take 1 tablet (5 mg total) by mouth daily. 90 tablet 3  . metFORMIN (GLUCOPHAGE) 1000 MG tablet Take 1,000 mg by mouth 2 (two) times daily with a meal. Take one tablet in the morning and one tablet at night with a meal.    . metoprolol tartrate (LOPRESSOR) 25 MG tablet Take 1 tablet (25 mg total) by mouth 2 (two) times daily. 180 tablet 3  . Multiple Vitamin (MULTIVITAMIN WITH MINERALS) TABS tablet Take 1 tablet by mouth daily.    . ticagrelor (BRILINTA) 90 MG TABS tablet  Take 1 tablet (90 mg total) by mouth 2 (two) times daily. 180 tablet 3   No current facility-administered medications for this visit.    Allergies:   Latex    Social History:  The patient  reports that he has been smoking cigarettes. He has a 30.00 pack-year smoking history. He has never used smokeless tobacco. He reports current alcohol use. He reports that he does not use drugs.   Family History:  The patient's family history includes Cancer in his mother.    ROS: All other systems are reviewed and negative. Unless otherwise mentioned in H&P    PHYSICAL EXAM: VS:  BP 115/78   Pulse 68   Ht 6'  (1.829 m)   Wt 249 lb (112.9 kg)   SpO2 98%   BMI 33.77 kg/m  , BMI Body mass index is 33.77 kg/m. GEN: Well nourished, obese, in no acute distress HEENT: normal Neck: no JVD, carotid bruits, or masses Cardiac: RRR; no murmurs, rubs, or gallops,no edema  Respiratory:  Clear to auscultation bilaterally, normal work of breathing GI: soft, nontender, nondistended, + BS MS: no deformity or atrophy Skin: warm and dry, no rash Neuro:  Strength and sensation are intact Psych: euthymic mood, full affect   EKG: Not done today.   Recent Labs: 08/07/2019: ALT 58; Magnesium 2.0 08/08/2019: TSH 1.368 08/09/2019: BUN 14; Creatinine, Ser 1.02; Hemoglobin 15.3; Platelets 125; Potassium 3.9; Sodium 136    Lipid Panel    Component Value Date/Time   CHOL 257 (H) 08/08/2019 0304   TRIG 227 (H) 08/08/2019 0304   HDL 35 (L) 08/08/2019 0304   CHOLHDL 7.3 08/08/2019 0304   VLDL 45 (H) 08/08/2019 0304   LDLCALC 177 (H) 08/08/2019 0304    Labs dated 01/09/20: cholesterol 134, triglycerides 218, HDL 44, LDL 55. A1c 11%. Glucose 437, potassium 5.3. other chemistries normal. CBC normal.   Wt Readings from Last 3 Encounters:  01/15/20 249 lb (112.9 kg)  08/18/19 257 lb (116.6 kg)  08/09/19 249 lb 14.4 oz (113.4 kg)      Other studies Reviewed: LHC 08/07/2019  Mid LAD lesion is 100% stenosed.  1st Diag lesion is 65% stenosed.  The left ventricular systolic function is normal.  LV end diastolic pressure is normal.  The left ventricular ejection fraction is 50-55% by visual estimate.  Post intervention, there is a 0% residual stenosis.  A drug-eluting stent was successfully placed using a STENT RESOLUTE ONYX 5.0X30.   1. 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 This vessel is very large and ectatic. Lesion was thrombotic.  2. Good overall LV function with apical HK 3. Normal LVEDP 4. Successful PCI of the mid LAD with thrombectomy and DES x 1.   Plan: will continue Aggrastat  for 18 hours. DAPT for one year. Aggressive risk factor modification. I would manage the disease in the first diagonal medically unless he has recurrent angina.   Echocardiogram 08/08/2019 1. Left ventricular ejection fraction, by visual estimation, is 45 to 50%. The left ventricle has mildly decreased function. There is mildly increased left ventricular hypertrophy.  2. Mid and apical anterior septum and apex are abnormal.  3. Left ventricular diastolic parameters are indeterminate.  4. Global right ventricle has normal systolic function.The right ventricular size is normal. No increase in right ventricular wall thickness.  5. Left atrial size was normal.  6. Right atrial size was normal.  7. The mitral valve is normal in structure. No evidence of mitral valve regurgitation.  8. The tricuspid valve is normal in structure. Tricuspid valve regurgitation is trivial.  9. The aortic valve is tricuspid. Aortic valve regurgitation is not visualized. 10. The pulmonic valve was not well visualized. Pulmonic valve regurgitation is not visualized. 11. Mildly elevated pulmonary artery systolic pressure. 12. The inferior vena cava is normal in size with <50% respiratory variability, suggesting right atrial pressure of 8 mmHg.  ASSESSMENT AND PLAN:  1. CAD: Status post anterior STEMI in November 2020 with DES of the mid LAD.   EF on echo revealed a reduced EF of 45 to 50% with mid and apical anterior septal, and apex wall motion abnormalities.  He is currently on dual antiplatelet therapy with ticagrelor 90 mg twice daily, and aspirin 81 mg daily.  He is asymptomatic. Will need DAPT for a year from his stent.   2.  Hypertension: Blood pressure is well controlled on lisinopril, and metoprolol.    3.  Non-insulin-dependent diabetes: Poorly controlled. Now managed by primary care. Discussed importance of weight loss, regular exercise, and low carb diet. I anticipate he will need more therapy.   4.   Hyperlipidemia: LDL at goal on lipitor. Triglycerides are mildly elevated reflecting his poor diabetic control  5. Tobacco abuse. Still smokes an occasional cigarette. Encourage complete smoking cessation.   Follow up in 6 months.   Cait Locust Martinique MD, [facility]     01/15/2020 8:37 AM    [address] Office [phone] Fax [phone]

## 2020-01-15 ENCOUNTER — Ambulatory Visit (INDEPENDENT_AMBULATORY_CARE_PROVIDER_SITE_OTHER): Payer: PRIVATE HEALTH INSURANCE | Admitting: Cardiology

## 2020-01-15 ENCOUNTER — Other Ambulatory Visit: Payer: Self-pay

## 2020-01-15 ENCOUNTER — Encounter: Payer: Self-pay | Admitting: Cardiology

## 2020-01-15 VITALS — BP 115/78 | HR 68 | Ht 72.0 in | Wt 249.0 lb

## 2020-01-15 DIAGNOSIS — E7800 Pure hypercholesterolemia, unspecified: Secondary | ICD-10-CM | POA: Diagnosis not present

## 2020-01-15 DIAGNOSIS — E118 Type 2 diabetes mellitus with unspecified complications: Secondary | ICD-10-CM | POA: Diagnosis not present

## 2020-01-15 DIAGNOSIS — Z720 Tobacco use: Secondary | ICD-10-CM

## 2020-01-15 DIAGNOSIS — I25118 Atherosclerotic heart disease of native coronary artery with other forms of angina pectoris: Secondary | ICD-10-CM | POA: Diagnosis not present

## 2020-04-12 IMAGING — DX DG CHEST 1V PORT
1 series · 1 of 1 positions shown · non-contrast
Comparison: None.

CLINICAL DATA: CP that started 30 min PTA while mowing the
lawn-denies fever/flu like sx. Rt elbow pain .cp

EXAM:
PORTABLE CHEST 1 VIEW

[chest ap]
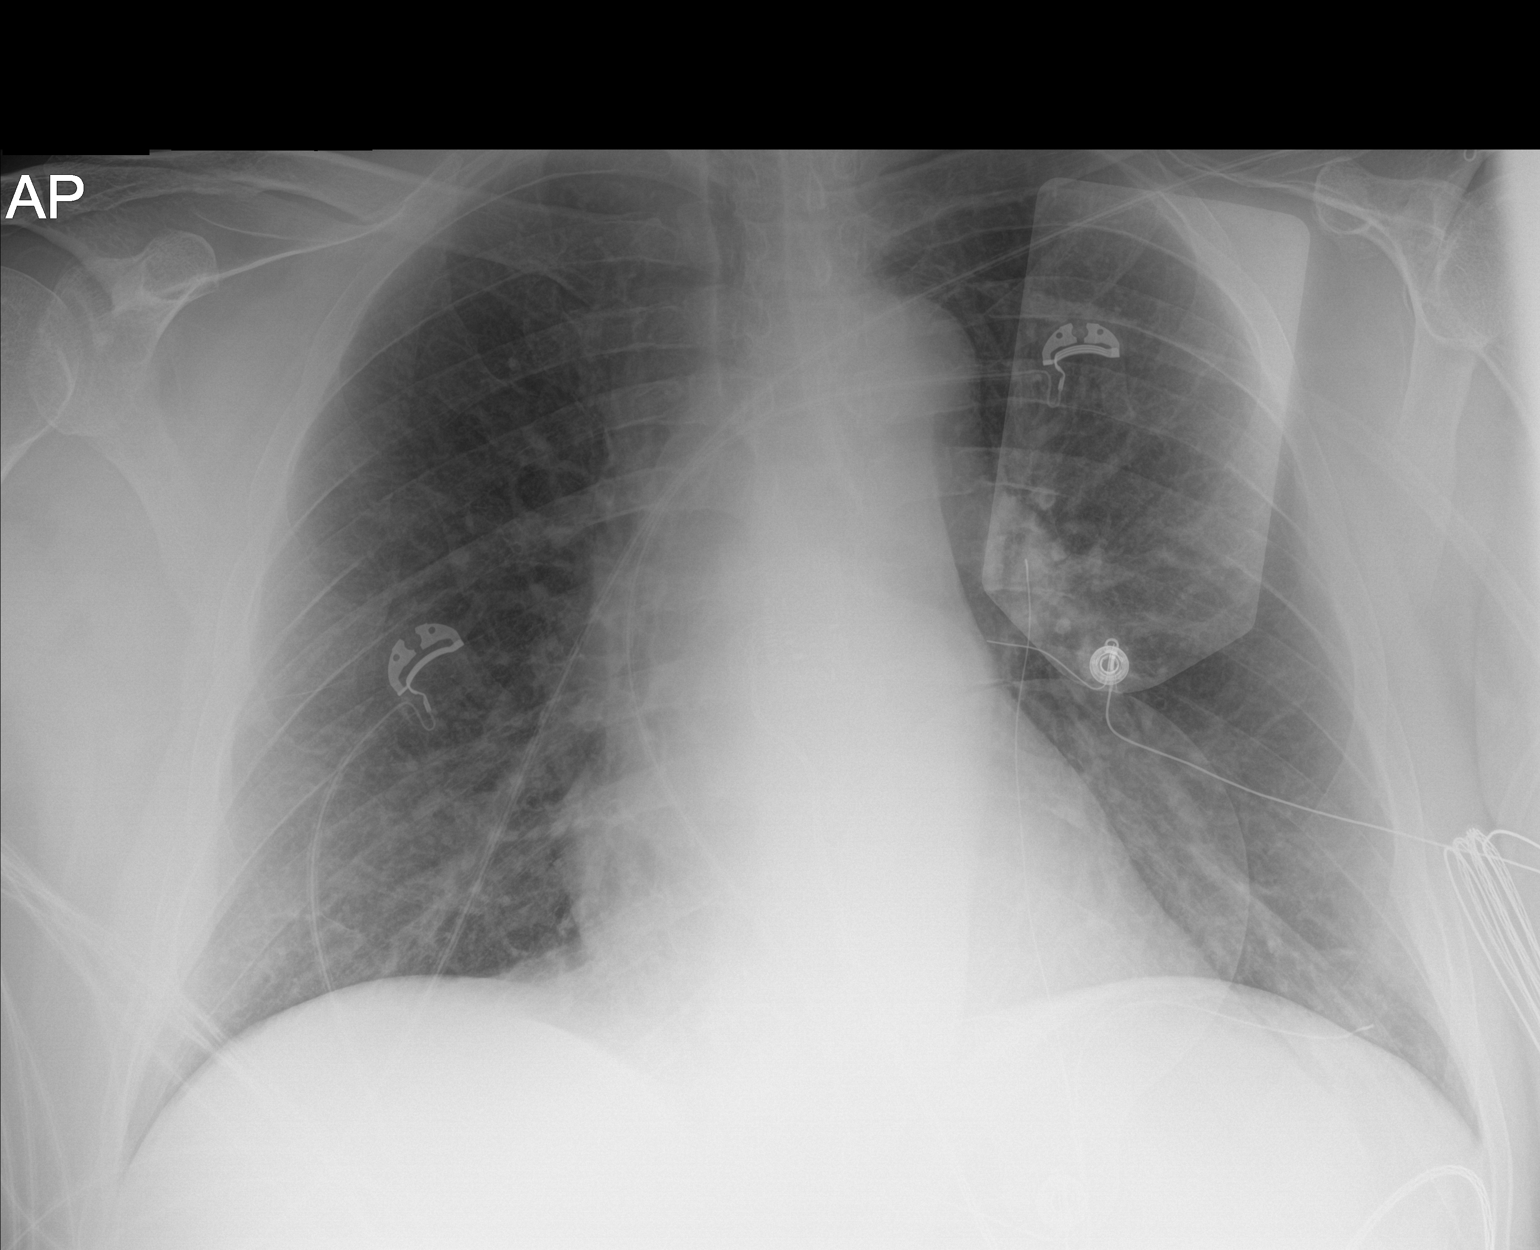

[1 of 1 positions shown; findings below may reference images not displayed]

FINDINGS: Heart size is normal. The lungs are free of focal consolidations and
pleural effusions. No pulmonary edema.
IMPRESSION: No active disease.

## 2020-08-16 NOTE — Progress Notes (Signed)
Cardiology Office Note   Date:  08/19/2020   ID:  Jason Wells, DOB [birth year], MRN [id number]  PCP:  Lucilla Edin  Cardiologist:  Dr. [place]  Chief Complaint  Patient presents with  . Coronary Artery Disease     History of Present Illness: Jason Wells is a 59 y.o. male who presents for follow up CAD.  He has a past medical history of OSA, Bell's palsy, and tobacco abuse.  He presented to med center at [facility] on 08/07/2019 after complaining of chest pain with pain radiating down both arms with associated diaphoresis with EKG changes anteriorly representing STEMI.  He was emergently transferred to [facility].  Emergent cardiac catheterization  revealed single-vessel occlusive CAD with 100% stenosis of the mid LAD and 65% first diagonal stenosis.  He had preserved EF of 50% to 55%.  He underwent successful thrombectomy with drug-eluting stent x1 to the mid LAD.  He was started on dual antiplatelet therapy with aspirin and Brilinta.   During hospitalization he was also having episodes of NSVT on telemetry and was started on Lopressor 25 mg twice daily.  Echocardiogram revealed EF of 45% to 50% with mid and apical anterior septum and apex wall motion abnormalities with no significant valve abnormalities.  He was discharged on aspirin, Brilinta, lisinopril 5 mg, Lopressor 25 mg twice daily.  Given his history of type 2 diabetes he was also started on Metformin.  On follow up today he is doing well. Denies any chest pain or SOB. Walks 5-7 miles per day at work and rides a stationary bike at home. His glycemic control has improved significantly. . A1c 11%>>6.9%.  Unable for afford Jardiance. No bleeding. No palpitations.    Past Medical History:  Diagnosis Date  . Bell's palsy   . CAD (coronary artery disease)    a. s/p STEMI in 08/2019 with DES to mid-LAD. Residual 65% D1 stenosis.   . Facial palsy   . Headache(784.0)   . Sleep apnea 8 years ago    Past Surgical  History:  Procedure Laterality Date  . CORONARY/GRAFT ACUTE MI REVASCULARIZATION N/A 08/07/2019   Procedure: CORONARY/GRAFT ACUTE MI REVASCULARIZATION;  Surgeon: Swaziland, Hanzel Pizzo M, MD;  Location: [facility] INVASIVE CV LAB;  Service: Cardiovascular;  Laterality: N/A;  . LEFT HEART CATH AND CORONARY ANGIOGRAPHY N/A 08/07/2019   Procedure: LEFT HEART CATH AND CORONARY ANGIOGRAPHY;  Surgeon: [place], Maxson Oddo M, MD;  Location: [facility] INVASIVE CV LAB;  Service: Cardiovascular;  Laterality: N/A;     Current Outpatient Medications  Medication Sig Dispense Refill  . aspirin EC 81 MG EC tablet Take 1 tablet (81 mg total) by mouth daily.    Marland Kitchen atorvastatin (LIPITOR) 80 MG tablet Take 1 tablet (80 mg total) by mouth daily at 6 PM. 90 tablet 3  . cetirizine (ZYRTEC) 10 MG tablet Take 10 mg by mouth daily.    Marland Kitchen lisinopril (ZESTRIL) 5 MG tablet Take 1 tablet (5 mg total) by mouth daily. 90 tablet 3  . metFORMIN (GLUCOPHAGE) 1000 MG tablet Take 1,000 mg by mouth 2 (two) times daily with a meal. Take one tablet in the morning and one tablet at night with a meal.    . metoprolol tartrate (LOPRESSOR) 25 MG tablet Take 1 tablet (25 mg total) by mouth 2 (two) times daily. 180 tablet 3  . Multiple Vitamin (MULTIVITAMIN WITH MINERALS) TABS tablet Take 1 tablet by mouth daily.     No current facility-administered medications for this visit.  Allergies:   Latex    Social History:  The patient  reports that he has been smoking cigarettes. He has a 30.00 pack-year smoking history. He has never used smokeless tobacco. He reports current alcohol use. He reports that he does not use drugs.   Family History:  The patient's family history includes Cancer in his mother.    ROS: All other systems are reviewed and negative. Unless otherwise mentioned in H&P    PHYSICAL EXAM: VS:  BP 116/82   Pulse 72   Ht 6' (1.829 m)   Wt 250 lb 12.8 oz (113.8 kg)   BMI 34.01 kg/m  , BMI Body mass index is 34.01 kg/m. GEN: Well nourished,  obese, in no acute distress HEENT: normal Neck: no JVD, carotid bruits, or masses Cardiac: RRR; no murmurs, rubs, or gallops,no edema  Respiratory:  Clear to auscultation bilaterally, normal work of breathing GI: soft, nontender, nondistended, + BS MS: no deformity or atrophy Skin: warm and dry, no rash Neuro:  Strength and sensation are intact Psych: euthymic mood, full affect   EKG: today shows NSR rate 72. LAD. Old Anterior infarct. I have personally reviewed and interpreted this study.    Recent Labs: No results found for requested labs within last 8760 hours.    Lipid Panel    Component Value Date/Time   CHOL 257 (H) 08/08/2019 0304   TRIG 227 (H) 08/08/2019 0304   HDL 35 (L) 08/08/2019 0304   CHOLHDL 7.3 08/08/2019 0304   VLDL 45 (H) 08/08/2019 0304   LDLCALC 177 (H) 08/08/2019 0304    Labs dated 01/09/20: cholesterol 134, triglycerides 218, HDL 44, LDL 55. A1c 11%. Glucose 437, potassium 5.3. other chemistries normal. CBC normal.  Dated 06/10/20: A1c 6.9%. glucose 125. CMET normal. Cholesterol 129, triglycerides 160, HDL 42, LDL 60.   Wt Readings from Last 3 Encounters:  08/19/20 250 lb 12.8 oz (113.8 kg)  01/15/20 249 lb (112.9 kg)  08/18/19 257 lb (116.6 kg)      Other studies Reviewed: LHC 08/07/2019  Mid LAD lesion is 100% stenosed.  1st Diag lesion is 65% stenosed.  The left ventricular systolic function is normal.  LV end diastolic pressure is normal.  The left ventricular ejection fraction is 50-55% by visual estimate.  Post intervention, there is a 0% residual stenosis.  A drug-eluting stent was successfully placed using a STENT RESOLUTE ONYX 5.0X30.   1. 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 This vessel is very large and ectatic. Lesion was thrombotic.  2. Good overall LV function with apical HK 3. Normal LVEDP 4. Successful PCI of the mid LAD with thrombectomy and DES x 1.   Plan: will continue Aggrastat for 18 hours. DAPT for  one year. Aggressive risk factor modification. I would manage the disease in the first diagonal medically unless he has recurrent angina.   Echocardiogram 08/08/2019 1. Left ventricular ejection fraction, by visual estimation, is 45 to 50%. The left ventricle has mildly decreased function. There is mildly increased left ventricular hypertrophy.  2. Mid and apical anterior septum and apex are abnormal.  3. Left ventricular diastolic parameters are indeterminate.  4. Global right ventricle has normal systolic function.The right ventricular size is normal. No increase in right ventricular wall thickness.  5. Left atrial size was normal.  6. Right atrial size was normal.  7. The mitral valve is normal in structure. No evidence of mitral valve regurgitation.  8. The tricuspid valve is normal in structure.  Tricuspid valve regurgitation is trivial.  9. The aortic valve is tricuspid. Aortic valve regurgitation is not visualized. 10. The pulmonic valve was not well visualized. Pulmonic valve regurgitation is not visualized. 11. Mildly elevated pulmonary artery systolic pressure. 12. The inferior vena cava is normal in size with <50% respiratory variability, suggesting right atrial pressure of 8 mmHg.  ASSESSMENT AND PLAN:  1. CAD: Status post anterior STEMI in November 2020 with DES of the mid LAD.   EF on echo revealed a reduced EF of 45 to 50% with mid and apical anterior septal, and apex wall motion abnormalities.   He is asymptomatic. Will discontinue Brilinta now. Continue other therapy. Follow up in one year.  2.  Hypertension: Blood pressure is well controlled on lisinopril, and metoprolol.    3.  Non-insulin-dependent diabetes: improved control.  Discussed importance of weight loss, regular exercise, and low carb diet.  4.  Hyperlipidemia: LDL at goal on lipitor. Triglycerides are improved with improved glycemic control.   5. Tobacco abuse. Still smokes an occasional cigarette. Encourage  complete smoking cessation.   Follow up in one year.  Emre Stock [place] MD, [facility]     08/19/2020 9:43 AM    [facility] [address] Office [phone] Fax [phone]

## 2020-08-19 ENCOUNTER — Other Ambulatory Visit: Payer: Self-pay

## 2020-08-19 ENCOUNTER — Encounter: Payer: Self-pay | Admitting: Cardiology

## 2020-08-19 ENCOUNTER — Ambulatory Visit (INDEPENDENT_AMBULATORY_CARE_PROVIDER_SITE_OTHER): Payer: PRIVATE HEALTH INSURANCE | Admitting: Cardiology

## 2020-08-19 VITALS — BP 116/82 | HR 72 | Ht 72.0 in | Wt 250.8 lb

## 2020-08-19 DIAGNOSIS — I25118 Atherosclerotic heart disease of native coronary artery with other forms of angina pectoris: Secondary | ICD-10-CM

## 2020-08-19 DIAGNOSIS — E7800 Pure hypercholesterolemia, unspecified: Secondary | ICD-10-CM | POA: Diagnosis not present

## 2020-08-19 DIAGNOSIS — Z720 Tobacco use: Secondary | ICD-10-CM | POA: Diagnosis not present

## 2020-08-19 MED ORDER — ATORVASTATIN CALCIUM 80 MG PO TABS: 80.0000 mg | ORAL_TABLET | Freq: Every day | ORAL | 3 refills | Status: DC

## 2020-08-19 MED ORDER — LISINOPRIL 5 MG PO TABS: 5.0000 mg | ORAL_TABLET | Freq: Every day | ORAL | 3 refills | Status: DC

## 2020-08-19 MED ORDER — METOPROLOL TARTRATE 25 MG PO TABS: 25.0000 mg | ORAL_TABLET | Freq: Two times a day (BID) | ORAL | 3 refills | Status: DC

## 2020-08-19 NOTE — Patient Instructions (Signed)
Stop taking Brilinta

## 2020-08-28 ENCOUNTER — Other Ambulatory Visit: Payer: Self-pay | Admitting: Adult Health

## 2020-09-11 ENCOUNTER — Other Ambulatory Visit: Payer: Self-pay | Admitting: Adult Health

## 2021-09-11 ENCOUNTER — Other Ambulatory Visit: Payer: Self-pay | Admitting: Cardiology

## 2021-09-15 ENCOUNTER — Other Ambulatory Visit: Payer: Self-pay

## 2021-09-15 ENCOUNTER — Telehealth: Payer: Self-pay | Admitting: Cardiology

## 2021-09-15 MED ORDER — LISINOPRIL 5 MG PO TABS: 5.0000 mg | ORAL_TABLET | Freq: Every day | ORAL | 0 refills | Status: DC

## 2021-09-15 MED ORDER — ATORVASTATIN CALCIUM 80 MG PO TABS: ORAL_TABLET | ORAL | 3 refills | Status: AC

## 2021-09-15 NOTE — Progress Notes (Signed)
Cardiology Office Note   Date:  09/19/2021   ID:  Jason Wells, DOB 1961-05-05, MRN [id number]  PCP:  Lucilla Edin  Cardiologist:  Dr. [place]  Chief Complaint  Patient presents with   Coronary Artery Disease      History of Present Illness: Jason Wells is a 60 y.o. male who presents for follow up CAD.  He has a past medical history of OSA, Bell's palsy, and tobacco abuse.  He presented to med center at [facility] on 08/07/2019 after complaining of chest pain with pain radiating down both arms with associated diaphoresis with EKG changes anteriorly representing STEMI.  He was emergently transferred to [facility].  Emergent cardiac catheterization  revealed single-vessel occlusive CAD with 100% stenosis of the mid LAD and 65% first diagonal stenosis.  He had preserved EF of 50% to 55%.  He underwent successful thrombectomy with drug-eluting stent x1 to the mid LAD.  He was started on dual antiplatelet therapy with aspirin and Brilinta.   During hospitalization he was also having episodes of NSVT on telemetry and was started on Lopressor 25 mg twice daily.  Echocardiogram revealed EF of 45% to 50% with mid and apical anterior septum and apex wall motion abnormalities with no significant valve abnormalities.  He was discharged on aspirin, Brilinta, lisinopril 5 mg, Lopressor 25 mg twice daily.  Given his history of type 2 diabetes he was also started on Metformin.  On follow up today he is doing well. Denies any chest pain or SOB. Walks a lot at work and rides a stationary bike 5 miles every other day.  A1c 11%>>6.9%>> 8.0%.  Unable for afford Jardiance.  No palpitations.    Past Medical History:  Diagnosis Date   Bell's palsy    CAD (coronary artery disease)    a. s/p STEMI in 08/2019 with DES to mid-LAD. Residual 65% D1 stenosis.    Facial palsy    Headache(784.0)    Sleep apnea 8 years ago    Past Surgical History:  Procedure Laterality Date   CORONARY/GRAFT  ACUTE MI REVASCULARIZATION N/A 08/07/2019   Procedure: CORONARY/GRAFT ACUTE MI REVASCULARIZATION;  Surgeon: [place], Twylah Bennetts M, MD;  Location: [facility] INVASIVE CV LAB;  Service: Cardiovascular;  Laterality: N/A;   LEFT HEART CATH AND CORONARY ANGIOGRAPHY N/A 08/07/2019   Procedure: LEFT HEART CATH AND CORONARY ANGIOGRAPHY;  Surgeon: [place], Otis Burress M, MD;  Location: [facility] INVASIVE CV LAB;  Service: Cardiovascular;  Laterality: N/A;     Current Outpatient Medications  Medication Sig Dispense Refill   aspirin EC 81 MG EC tablet Take 1 tablet (81 mg total) by mouth daily.     atorvastatin (LIPITOR) 80 MG tablet TAKE 1 TABLET(80 MG) BY MOUTH DAILY AT 6 PM 90 tablet 3   cetirizine (ZYRTEC) 10 MG tablet Take 10 mg by mouth daily.     metFORMIN (GLUCOPHAGE) 1000 MG tablet Take 1,000 mg by mouth 2 (two) times daily with a meal. Take one tablet in the morning and one tablet at night with a meal.     Multiple Vitamin (MULTIVITAMIN WITH MINERALS) TABS tablet Take 1 tablet by mouth daily.     lisinopril (ZESTRIL) 5 MG tablet Take 1 tablet (5 mg total) by mouth daily. Patient need a appointment for future refills. 90 tablet 3   metoprolol tartrate (LOPRESSOR) 25 MG tablet Take 0.5 tablets (12.5 mg total) by mouth 2 (two) times daily. Patient need a appointment for future refills. 90 tablet 3   No  current facility-administered medications for this visit.    Allergies:   Cat hair extract, Other, and Latex    Social History:  The patient  reports that he has been smoking cigarettes. He has a 30.00 pack-year smoking history. He has never used smokeless tobacco. He reports current alcohol use. He reports that he does not use drugs.   Family History:  The patient's family history includes Cancer in his mother.    ROS: All other systems are reviewed and negative. Unless otherwise mentioned in H&P    PHYSICAL EXAM: VS:  BP 120/80 (BP Location: Left Arm)    Pulse 62    Ht 6' (1.829 m)    Wt 261 lb 6.4 oz (118.6 kg)     SpO2 98%    BMI 35.45 kg/m  , BMI Body mass index is 35.45 kg/m. GEN: Well nourished, obese, in no acute distress HEENT: normal Neck: no JVD, carotid bruits, or masses Cardiac: RRR; no murmurs, rubs, or gallops,no edema  Respiratory:  Clear to auscultation bilaterally, normal work of breathing GI: soft, nontender, nondistended, + BS MS: no deformity or atrophy Skin: warm and dry, no rash Neuro:  Strength and sensation are intact Psych: euthymic mood, full affect   EKG: today shows NSR rate 62. Low voltage. I have personally reviewed and interpreted this study.    Recent Labs: No results found for requested labs within last 8760 hours.    Lipid Panel    Component Value Date/Time   CHOL 257 (H) 08/08/2019 0304   TRIG 227 (H) 08/08/2019 0304   HDL 35 (L) 08/08/2019 0304   CHOLHDL 7.3 08/08/2019 0304   VLDL 45 (H) 08/08/2019 0304   LDLCALC 177 (H) 08/08/2019 0304    Labs dated 01/09/20: cholesterol 134, triglycerides 218, HDL 44, LDL 55. A1c 11%. Glucose 437, potassium 5.3. other chemistries normal. CBC normal.  Dated 06/10/20: A1c 6.9%. glucose 125. CMET normal. Cholesterol 129, triglycerides 160, HDL 42, LDL 60.  Dated 11/04/20: cholesterol 133, triglycerides 146, HDL 48, LDL 60. CBC normal. Dated 02/10/21: normal CMET except glucose 154, A1c 8%.   Wt Readings from Last 3 Encounters:  09/19/21 261 lb 6.4 oz (118.6 kg)  08/19/20 250 lb 12.8 oz (113.8 kg)  01/15/20 249 lb (112.9 kg)      Other studies Reviewed: LHC 08/07/2019 Mid LAD lesion is 100% stenosed. 1st Diag lesion is 65% stenosed. The left ventricular systolic function is normal. LV end diastolic pressure is normal. The left ventricular ejection fraction is 50-55% by visual estimate. Post intervention, there is a 0% residual stenosis. A drug-eluting stent was successfully placed using a STENT RESOLUTE ONYX 5.0X30.   1. 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 This vessel is very large and ectatic. Lesion  was thrombotic.  2. Good overall LV function with apical HK 3. Normal LVEDP 4. Successful PCI of the mid LAD with thrombectomy and DES x 1.    Plan: will continue Aggrastat for 18 hours. DAPT for one year. Aggressive risk factor modification. I would manage the disease in the first diagonal medically unless he has recurrent angina.   Echocardiogram 08/08/2019 1. Left ventricular ejection fraction, by visual estimation, is 45 to 50%. The left ventricle has mildly decreased function. There is mildly increased left ventricular hypertrophy.  2. Mid and apical anterior septum and apex are abnormal.  3. Left ventricular diastolic parameters are indeterminate.  4. Global right ventricle has normal systolic function.The right ventricular size is normal. No increase  in right ventricular wall thickness.  5. Left atrial size was normal.  6. Right atrial size was normal.  7. The mitral valve is normal in structure. No evidence of mitral valve regurgitation.  8. The tricuspid valve is normal in structure. Tricuspid valve regurgitation is trivial.  9. The aortic valve is tricuspid. Aortic valve regurgitation is not visualized. 10. The pulmonic valve was not well visualized. Pulmonic valve regurgitation is not visualized. 11. Mildly elevated pulmonary artery systolic pressure. 12. The inferior vena cava is normal in size with <50% respiratory variability, suggesting right atrial pressure of 8 mmHg.  ASSESSMENT AND PLAN:  1. CAD: Status post anterior STEMI in November 2020 with DES of the mid LAD.   EF on echo revealed a reduced EF of 45 to 50% with mid and apical anterior septal, and apex wall motion abnormalities.   He is asymptomatic.  Continue ASA, beta blocker, ACEi and statin.  Follow up in one year.  2.  Hypertension: Blood pressure is well controlled on lisinopril, and metoprolol.    3.  Non-insulin-dependent diabetes: Last A1c 8%. Management per primary care.   Discussed importance of weight  loss, regular exercise, and low carb diet.  4.  Hyperlipidemia: LDL at goal on lipitor. Triglycerides are improved with improved glycemic control.   5. Tobacco abuse. Still smokes an occasional cigarette. Encourage complete smoking cessation.   Follow up in one year.  Navie Lamoreaux [place] MD, [facility]     09/19/2021 9:02 AM    [facility] [address] Office [phone] Fax [phone]

## 2021-09-15 NOTE — Telephone Encounter (Signed)
°*  STAT* If patient is at the pharmacy, call can be transferred to refill team.   1. Which medications need to be refilled? (please list name of each medication and dose if known) atorvastatin (LIPITOR) 80 MG tablet; lisinopril (ZESTRIL) 5 MG tablet  2. Which pharmacy/location (including street and city if local pharmacy) is medication to be sent to? [id number] - [place], [place] - [address] [place] [address]  3. Do they need a 30 day or 90 day supply? 90

## 2021-09-19 ENCOUNTER — Ambulatory Visit (INDEPENDENT_AMBULATORY_CARE_PROVIDER_SITE_OTHER): Payer: BLUE CROSS/BLUE SHIELD | Admitting: Cardiology

## 2021-09-19 ENCOUNTER — Encounter: Payer: Self-pay | Admitting: Cardiology

## 2021-09-19 ENCOUNTER — Other Ambulatory Visit: Payer: Self-pay

## 2021-09-19 VITALS — BP 120/80 | HR 62 | Ht 72.0 in | Wt 261.4 lb

## 2021-09-19 DIAGNOSIS — I25118 Atherosclerotic heart disease of native coronary artery with other forms of angina pectoris: Secondary | ICD-10-CM

## 2021-09-19 DIAGNOSIS — E118 Type 2 diabetes mellitus with unspecified complications: Secondary | ICD-10-CM | POA: Diagnosis not present

## 2021-09-19 DIAGNOSIS — E7800 Pure hypercholesterolemia, unspecified: Secondary | ICD-10-CM

## 2021-09-19 MED ORDER — METOPROLOL TARTRATE 25 MG PO TABS: 12.5000 mg | ORAL_TABLET | Freq: Two times a day (BID) | ORAL | 3 refills | Status: DC

## 2021-09-19 MED ORDER — LISINOPRIL 5 MG PO TABS: 5.0000 mg | ORAL_TABLET | Freq: Every day | ORAL | 3 refills | Status: DC

## 2022-05-13 LAB — COLOGUARD: COLOGUARD: NEGATIVE

## 2022-11-16 ENCOUNTER — Other Ambulatory Visit: Payer: Self-pay | Admitting: Cardiology

## 2022-11-16 ENCOUNTER — Other Ambulatory Visit: Payer: Self-pay

## 2022-11-16 MED ORDER — METOPROLOL TARTRATE 25 MG PO TABS: 12.5000 mg | ORAL_TABLET | Freq: Two times a day (BID) | ORAL | 0 refills | Status: DC

## 2022-12-14 ENCOUNTER — Other Ambulatory Visit: Payer: Self-pay | Admitting: Cardiology

## 2022-12-29 ENCOUNTER — Other Ambulatory Visit: Payer: Self-pay | Admitting: Cardiology

## 2023-03-04 ENCOUNTER — Other Ambulatory Visit: Payer: Self-pay | Admitting: Cardiology

## 2023-03-17 ENCOUNTER — Other Ambulatory Visit: Payer: Self-pay | Admitting: Cardiology

## 2023-03-19 ENCOUNTER — Other Ambulatory Visit: Payer: Self-pay | Admitting: Cardiology

## 2023-05-02 ENCOUNTER — Other Ambulatory Visit: Payer: Self-pay | Admitting: Cardiology

## 2023-05-16 ENCOUNTER — Other Ambulatory Visit: Payer: Self-pay | Admitting: Cardiology

## 2023-06-01 ENCOUNTER — Other Ambulatory Visit: Payer: Self-pay | Admitting: Cardiology

## 2023-06-15 ENCOUNTER — Other Ambulatory Visit: Payer: Self-pay | Admitting: Cardiology

## 2023-06-29 ENCOUNTER — Other Ambulatory Visit: Payer: Self-pay | Admitting: Cardiology

## 2023-07-13 ENCOUNTER — Other Ambulatory Visit: Payer: Self-pay | Admitting: Cardiology

## 2023-07-13 NOTE — Telephone Encounter (Signed)
Patient needs an appointment for additional refills patient was last seen on 09/19/21

## 2023-09-16 ENCOUNTER — Other Ambulatory Visit: Payer: Self-pay | Admitting: Cardiology

## 2023-09-27 NOTE — Telephone Encounter (Signed)
Patient is scheduled to see Marjie Skiff 01/09 at 10:05 am.

## 2023-10-01 NOTE — Progress Notes (Signed)
 Cardiology Office Note:    Date:  10/11/2023   ID:  Jason Wells, DOB 26-Mar-1961, MRN [id number]  PCP:  Jerilynn Maurilio GAILS, PA-C  Cardiologist:  Peter Jordan, MD     Referring MD: Jerilynn Maurilio GAILS, PA-C   Chief Complaint: follow up of CAD  History of Present Illness:    Jason Wells is a 61 y.o. male with a history of CAD with STEMI in 08/2019 s/p DES to LAD, ischemic cardiomyopathy with EF of 45-50% in 08/2019, NSVT, hypertension, hyperlipidemia, type 2 diabetes mellitus, obstructive sleep apnea, Bell's palsy, and tobacco abuse who is followed by Dr. Jordan and presents today for routine follow-up.  Patient was admitted in 08/2019 for an acute STEMI. Emergent cardiac catheterization showed 100% stenosis of mid LAD and 65% of 1st Diag. He underwent successful PCI with thrombectomy and DES to LAD lesion. Echo at that time showed LVEF of 45-50% with akinesis of the mid and apical anterior septum and hypokinesis of the apex. He was also noted to have episodes of NSVT on telemetry during that admission.   He was last seen by Dr. Jordan in 09/2021 at which time he was doing well with no chest pain or shortness of breath.   Patient presents today for follow-up.  Overall, he is doing well from a cardiac standpoint.  He reports some mild vague chest discomfort if he overdoes it such as if he tries to push mow his entire yard and pick up all the sticks at 1 time other than breaking it up.  He describes his discomfort as feeling like his heart is beating harder and being a little winded but he denies any chest pain like what he experienced at the time of his MI in 2020.  He denies any sensations like this with routine activity.  No shortness of breath, orthopnea, PND, edema, palpitations, lightheadedness, dizziness, syncope.  He has some bilateral leg numbness which is new since last visit; however, he denies any leg pain that sounds like claudication or slow healing wounds on his feet.  He has  good posterior tibial and distal pedal pulses on exam so do not think this is PAD.  Sounds like possible peripheral neuropathy with his diabetes.  He is compliant with his medications.  He was previously on Lisinopril  but states his PCP stopped this about 6 months ago due to elevated kidney function. He has Lopressor  25mg  twice daily listed under his chart but states he is only taking 12.5mg  twice daily because he had significant fatigue on the higher dose. He used to be on Metformin  but now he is on Jardiance and Ozempic for his diabetes.   He continues to smoke 3-5 cigarettes a day.    EKGs/Labs/Other Studies Reviewed:    The following studies were reviewed:  Cardiac Catheterization 08/07/2019: Mid LAD lesion is 100% stenosed. 1st Diag lesion is 65% stenosed. The left ventricular systolic function is normal. LV end diastolic pressure is normal. The left ventricular ejection fraction is 50-55% by visual estimate. Post intervention, there is a 0% residual stenosis. A drug-eluting stent was successfully placed using a STENT RESOLUTE ONYX 5.0X30.   1. 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 This vessel is very large and ectatic. Lesion was thrombotic.  2. Good overall LV function with apical HK 3. Normal LVEDP 4. Successful PCI of the mid LAD with thrombectomy and DES x 1.    Plan: will continue Aggrastat  for 18 hours. DAPT for one year. Aggressive risk  factor modification. I would manage the disease in the first diagonal medically unless he has recurrent angina.   Diagnostic Dominance: Right  Intervention    _______________  Echocardiogram 08/08/2019: Impressions: 1. Left ventricular ejection fraction, by visual estimation, is 45 to  50%. The left ventricle has mildly decreased function. There is mildly  increased left ventricular hypertrophy.   2. Mid and apical anterior septum and apex are abnormal.   3. Left ventricular diastolic parameters are indeterminate.   4.  Global right ventricle has normal systolic function.The right  ventricular size is normal. No increase in right ventricular wall  thickness.   5. Left atrial size was normal.   6. Right atrial size was normal.   7. The mitral valve is normal in structure. No evidence of mitral valve  regurgitation.   8. The tricuspid valve is normal in structure. Tricuspid valve  regurgitation is trivial.   9. The aortic valve is tricuspid. Aortic valve regurgitation is not  visualized.  10. The pulmonic valve was not well visualized. Pulmonic valve  regurgitation is not visualized.  11. Mildly elevated pulmonary artery systolic pressure.  12. The inferior vena cava is normal in size with <50% respiratory  variability, suggesting right atrial pressure of 8 mmHg.   EKG:  EKG ordered today.   EKG Interpretation Date/Time:  Thursday October 11 2023 09:50:22 EST Ventricular Rate:  78 PR Interval:  168 QRS Duration:  74 QT Interval:  386 QTC Calculation: 440 R Axis:   -11  Text Interpretation: Normal sinus rhythm Normal ECG  No acute ST/ T wave changes Confirmed by Sanvika Cuttino [phone]) on 10/11/2023 9:57:59 AM    Recent Labs: No results found for requested labs within last 365 days.  Recent Lipid Panel    Component Value Date/Time   CHOL 257 (H) 08/08/2019 0304   TRIG 227 (H) 08/08/2019 0304   HDL 35 (L) 08/08/2019 0304   CHOLHDL 7.3 08/08/2019 0304   VLDL 45 (H) 08/08/2019 0304   LDLCALC 177 (H) 08/08/2019 0304    Physical Exam:    Vital Signs: BP 130/85   Pulse 78   Ht 6' (1.829 m)   Wt 245 lb 12.8 oz (111.5 kg)   SpO2 96%   BMI 33.34 kg/m     Wt Readings from Last 3 Encounters:  10/11/23 245 lb 12.8 oz (111.5 kg)  09/19/21 261 lb 6.4 oz (118.6 kg)  08/19/20 250 lb 12.8 oz (113.8 kg)     General: 62 y.o. Caucasian male in no acute distress. HEENT: Normocephalic and atraumatic. Sclera clear.  Neck: Supple. No carotid bruits. No JVD. Heart: RRR. Distinct S1 and S2. No  murmurs, gallops, or rubs.  Lungs: No increased work of breathing. Clear to ausculation bilaterally. No wheezes, rhonchi, or rales.  Extremities: No lower extremity edema. Posterior tibial and distal pedal pulses 2+ and equal bilaterally. Skin: Warm and dry. Neuro: No focal deficits. Psych: Normal affect. Responds appropriately.  Assessment:    1. Coronary artery disease involving native coronary artery of native heart without angina pectoris   2. Ischemic cardiomyopathy   3. Primary hypertension   4. Hypercholesterolemia   5. Type 2 diabetes mellitus with complication, without long-term current use of insulin  (HCC)   6. Obstructive sleep apnea   7. Tobacco abuse     Plan:    CAD  History of STEMI in 08/2019 s/p thrombectomy and DES to mid LAD.  - He describes some mild vague chest discomfort when he  overdoes it (please see description of this in HPI) but no other chest pain. It is not like the pain he had prior to his MI. - Continue aspirin , beta-blocker, and statin. - He does not have any PRN sublingual Nitroglycerin  at home so will prescribe this for him today. Educated patient on how to take this. - No additional ischemic work-up necessary at this time. Advised patient to let us  know if he has any new or worsening chest discomfort of if he has any symptoms that resemble what he had at the time of his MI.  Ischemic Cardiomyopathy Echo in 08/2019 at time of MI showed LVEF of 45-50% with akinesis of the mid and apical anterior septum and hypokinesis of the apex. - No signs or symptoms of CHF.  - Continue Lopressor  12.5mg  twice daily. - Continue Jardiance 25mg  daily.  - Previously on Lisinopril  but PCP reportedly stopped this due to elevated renal function. - Will repeat Echo to reassess LV function.   Hypertension BP borderline elevated in the office at 130/85; however, he states it is usually in the 120s/ 70s.  - Continue Lopressor  as above. - Advised patient to continue to  monitor BP at home and notify us  if consistently >130/80.   Hyperlipidemia Lipid panel in 06/2023: Total Cholesterol 125, Triglycerides 120, HDL 47, LDL 56.  - Continue Lipitor  80mg  daily.   Type 2 Diabetes Mellitus Hemoglobin 6.2% in 06/2023. - On Jardiance and Ozempic.  - Management per PCP.   Obstructive Sleep Apnea - Continue CPAP. Compliant with this.  Tobacco Abuse He continues to smoke 3-5 cigarettes per day. He states he smokes when he is stressed.  - Discussed importance of complete cessation. Offered Nicotine  patches but he declined.   Disposition: Follow up in 1 year.   Signed, Aline FORBES Door, PA-C  10/11/2023 10:32 AM     [facility]

## 2023-10-11 ENCOUNTER — Ambulatory Visit: Payer: Managed Care, Other (non HMO) | Attending: Student | Admitting: Student

## 2023-10-11 ENCOUNTER — Encounter: Payer: Self-pay | Admitting: Student

## 2023-10-11 VITALS — BP 130/85 | HR 78 | Ht 72.0 in | Wt 245.8 lb

## 2023-10-11 DIAGNOSIS — Z720 Tobacco use: Secondary | ICD-10-CM

## 2023-10-11 DIAGNOSIS — I2510 Atherosclerotic heart disease of native coronary artery without angina pectoris: Secondary | ICD-10-CM

## 2023-10-11 DIAGNOSIS — I255 Ischemic cardiomyopathy: Secondary | ICD-10-CM | POA: Diagnosis not present

## 2023-10-11 DIAGNOSIS — I10 Essential (primary) hypertension: Secondary | ICD-10-CM | POA: Diagnosis not present

## 2023-10-11 DIAGNOSIS — E7800 Pure hypercholesterolemia, unspecified: Secondary | ICD-10-CM | POA: Diagnosis not present

## 2023-10-11 DIAGNOSIS — G4733 Obstructive sleep apnea (adult) (pediatric): Secondary | ICD-10-CM

## 2023-10-11 DIAGNOSIS — E118 Type 2 diabetes mellitus with unspecified complications: Secondary | ICD-10-CM

## 2023-10-11 MED ORDER — METOPROLOL TARTRATE 25 MG PO TABS: 12.5000 mg | ORAL_TABLET | Freq: Two times a day (BID) | ORAL | Status: DC

## 2023-10-11 MED ORDER — NITROGLYCERIN 0.4 MG SL SUBL: 0.4000 mg | SUBLINGUAL_TABLET | SUBLINGUAL | 3 refills | Status: DC | PRN

## 2023-10-11 NOTE — Patient Instructions (Signed)
 Medication Instructions:  Nitroglycerin  0.4 mg Sublingual   NITROGLYCERIN   is a type of vasodilator. It relaxes blood vessels, increasing the blood and oxygen supply to your heart. This medicine is used to relieve chest pain caused by angina.  In an angina attack (CHEST PAIN), you should feel better within 5 minutes after your first dose. Do not swallow whole. Place tablet under your tongue. Sit down when taking this medicine. You can take a dose every 5 minutes up to a total of 3 doses. If you do not feel better or feel worse after 1 dose, call 9-1-1 at once. Do not take more than 3 doses in 15 minutes. Do not take your medicine more often than directed.  *If you need a refill on your cardiac medications before your next appointment, please call your pharmacy*   Testing/Procedures: Your physician has requested that you have an echocardiogram. Echocardiography is a painless test that uses sound waves to create images of your heart. It provides your doctor with information about the size and shape of your heart and how well your heart's chambers and valves are working. This procedure takes approximately one hour. There are no restrictions for this procedure. Please do NOT wear cologne, perfume, aftershave, or lotions (deodorant is allowed). Please arrive 15 minutes prior to your appointment time.  Please note: We ask at that you not bring children with you during ultrasound (echo/ vascular) testing. Due to room size and safety concerns, children are not allowed in the ultrasound rooms during exams. Our front office staff cannot provide observation of children in our lobby area while testing is being conducted. An adult accompanying a patient to their appointment will only be allowed in the ultrasound room at the discretion of the ultrasound technician under special circumstances. We apologize for any inconvenience.    Follow-Up: At [facility], you and your health needs are our priority.   As part of our continuing mission to provide you with exceptional heart care, we have created designated Provider Care Teams.  These Care Teams include your primary Cardiologist (physician) and Advanced Practice Providers (APPs -  Physician Assistants and Nurse Practitioners) who all work together to provide you with the care you need, when you need it.  We recommend signing up for the patient portal called MyChart.  Sign up information is provided on this After Visit Summary.  MyChart is used to connect with patients for Virtual Visits (Telemedicine).  Patients are able to view lab/test results, encounter notes, upcoming appointments, etc.  Non-urgent messages can be sent to your provider as well.   To learn more about what you can do with MyChart, go to [email or web address].    Your next appointment:   1 year(s)  Provider:   Dr Jordan or Callie Goodrich, PA-C

## 2023-10-21 ENCOUNTER — Other Ambulatory Visit: Payer: Self-pay | Admitting: Cardiology

## 2023-10-22 ENCOUNTER — Other Ambulatory Visit: Payer: Self-pay

## 2023-10-22 MED ORDER — METOPROLOL TARTRATE 25 MG PO TABS: 12.5000 mg | ORAL_TABLET | Freq: Two times a day (BID) | ORAL | 3 refills | Status: DC

## 2023-11-03 ENCOUNTER — Other Ambulatory Visit: Payer: Self-pay | Admitting: Student

## 2023-11-05 ENCOUNTER — Other Ambulatory Visit: Payer: Self-pay | Admitting: Student

## 2023-11-15 ENCOUNTER — Ambulatory Visit (HOSPITAL_COMMUNITY): Admission: RE | Admit: 2023-11-15 | Payer: BLUE CROSS/BLUE SHIELD | Source: Ambulatory Visit

## 2023-11-22 ENCOUNTER — Other Ambulatory Visit: Payer: Self-pay | Admitting: Cardiology

## 2023-11-22 ENCOUNTER — Other Ambulatory Visit: Payer: Self-pay

## 2023-11-22 MED ORDER — METOPROLOL TARTRATE 25 MG PO TABS: 12.5000 mg | ORAL_TABLET | Freq: Two times a day (BID) | ORAL | 3 refills | Status: AC

## 2024-02-18 ENCOUNTER — Encounter: Payer: Self-pay | Admitting: Student

## 2024-04-11 ENCOUNTER — Encounter: Payer: Self-pay | Admitting: Student

## 2024-08-22 ENCOUNTER — Other Ambulatory Visit: Payer: Self-pay | Admitting: Student
